# Patient Record
Sex: Female | Born: 1965 | Race: White | Hispanic: No | Marital: Married | Smoking: Never smoker
Health system: Southern US, Community
[De-identification: ages and names within clinical notes are randomized; demographics above are authoritative.]

## PROBLEM LIST (undated history)

## (undated) DIAGNOSIS — K259 Gastric ulcer, unspecified as acute or chronic, without hemorrhage or perforation: Secondary | ICD-10-CM

## (undated) DIAGNOSIS — E7800 Pure hypercholesterolemia, unspecified: Secondary | ICD-10-CM

## (undated) DIAGNOSIS — M810 Age-related osteoporosis without current pathological fracture: Secondary | ICD-10-CM

## (undated) DIAGNOSIS — N289 Disorder of kidney and ureter, unspecified: Secondary | ICD-10-CM

## (undated) DIAGNOSIS — K589 Irritable bowel syndrome without diarrhea: Secondary | ICD-10-CM

## (undated) HISTORY — DX: Gastric ulcer, unspecified as acute or chronic, without hemorrhage or perforation: K25.9

## (undated) HISTORY — DX: Age-related osteoporosis without current pathological fracture: M81.0

## (undated) HISTORY — DX: Pure hypercholesterolemia, unspecified: E78.00

## (undated) HISTORY — PX: LITHOTRIPSY: SUR834

## (undated) HISTORY — DX: Irritable bowel syndrome, unspecified: K58.9

## (undated) HISTORY — DX: Disorder of kidney and ureter, unspecified: N28.9

## (undated) HISTORY — PX: BACK SURGERY: SHX140

## (undated) HISTORY — PX: SINUS EXPLORATION: SHX5214

---

## 2003-01-08 ENCOUNTER — Encounter: Payer: Self-pay | Admitting: Neurosurgery

## 2003-01-08 ENCOUNTER — Ambulatory Visit (HOSPITAL_COMMUNITY): Admission: RE | Admit: 2003-01-08 | Discharge: 2003-01-08 | Payer: Self-pay | Admitting: Neurosurgery

## 2014-11-28 LAB — LAB REPORT - SCANNED

## 2017-03-01 DIAGNOSIS — J301 Allergic rhinitis due to pollen: Secondary | ICD-10-CM | POA: Insufficient documentation

## 2017-10-29 LAB — VITAMIN B12: Vitamin B-12: 437

## 2017-10-29 LAB — CBC AND DIFFERENTIAL
Neutrophils Absolute: 3.4
Platelets: 219 (ref 150–399)
WBC: 6.3

## 2017-10-29 LAB — BASIC METABOLIC PANEL
BUN: 14 (ref 4–21)
CO2: 29 — AB (ref 13–22)
Chloride: 103 (ref 99–108)
Creatinine: 0.8 (ref <=1.1)
Glucose: 77
Potassium: 4.8 (ref 3.4–5.3)
Sodium: 143 (ref 137–147)

## 2017-10-29 LAB — HEPATIC FUNCTION PANEL
ALT: 9 (ref 7–35)
AST: 14 (ref 13–35)
Alkaline Phosphatase: 51 (ref 25–125)
Bilirubin, Total: 0.1

## 2017-10-29 LAB — COMPREHENSIVE METABOLIC PANEL
Albumin: 4.4 (ref 3.5–5.0)
Calcium: 9.5 (ref 8.7–10.7)
Globulin: 2.4

## 2017-10-29 LAB — CBC: RBC: 4.2 (ref 3.87–5.11)

## 2017-10-29 LAB — VITAMIN D 25 HYDROXY (VIT D DEFICIENCY, FRACTURES): Vit D, 25-Hydroxy: 26.8

## 2017-11-05 LAB — HM COLONOSCOPY

## 2018-11-19 LAB — TSH: TSH: 1.66 (ref 0.41–5.90)

## 2020-01-05 ENCOUNTER — Other Ambulatory Visit: Payer: Self-pay | Admitting: Specialist

## 2020-01-05 DIAGNOSIS — M545 Low back pain, unspecified: Secondary | ICD-10-CM

## 2020-01-05 DIAGNOSIS — M25551 Pain in right hip: Secondary | ICD-10-CM

## 2020-01-05 DIAGNOSIS — G8929 Other chronic pain: Secondary | ICD-10-CM

## 2020-01-18 ENCOUNTER — Other Ambulatory Visit: Payer: Self-pay

## 2020-01-18 ENCOUNTER — Ambulatory Visit
Admission: RE | Admit: 2020-01-18 | Discharge: 2020-01-18 | Disposition: A | Discharge status: Home / Self Care | Payer: 59 | Source: Ambulatory Visit | Attending: Specialist | Admitting: Specialist

## 2020-01-18 DIAGNOSIS — M545 Low back pain, unspecified: Secondary | ICD-10-CM

## 2020-01-18 DIAGNOSIS — G8929 Other chronic pain: Secondary | ICD-10-CM

## 2020-01-18 MED ORDER — METHYLPREDNISOLONE ACETATE 40 MG/ML INJ SUSP (RADIOLOG
120.0000 mg | Freq: Once | INTRAMUSCULAR | Status: AC
  Administered 2020-01-18: 120 mg via EPIDURAL

## 2020-01-18 MED ORDER — IOPAMIDOL (ISOVUE-M 200) INJECTION 41%
1.0000 mL | Freq: Once | INTRAMUSCULAR | Status: AC
  Administered 2020-01-18: 1 mL via EPIDURAL

## 2020-01-18 NOTE — Discharge Instructions (Signed)

## 2020-01-22 LAB — BASIC METABOLIC PANEL
BUN: 13 (ref 4–21)
CO2: 27 — AB (ref 13–22)
Chloride: 102 (ref 99–108)
Creatinine: 0.8 (ref 0.5–1.1)
Glucose: 77
Potassium: 4.9 (ref 3.4–5.3)
Sodium: 141 (ref 137–147)

## 2020-01-22 LAB — TSH: TSH: 1.6 (ref 0.41–5.90)

## 2020-01-22 LAB — COMPREHENSIVE METABOLIC PANEL
Albumin: 4.3 (ref 3.5–5.0)
Calcium: 9.6 (ref 8.7–10.7)
Globulin: 2.3

## 2020-01-22 LAB — HEPATIC FUNCTION PANEL
ALT: 13 (ref 7–35)
AST: 16 (ref 13–35)
Alkaline Phosphatase: 50 (ref 25–125)
Bilirubin, Total: 0.5

## 2020-02-01 ENCOUNTER — Ambulatory Visit
Admission: RE | Admit: 2020-02-01 | Discharge: 2020-02-01 | Disposition: A | Discharge status: Home / Self Care | Payer: 59 | Source: Ambulatory Visit | Attending: Specialist | Admitting: Specialist

## 2020-02-01 DIAGNOSIS — M25551 Pain in right hip: Secondary | ICD-10-CM

## 2020-02-01 MED ORDER — IOPAMIDOL (ISOVUE-M 200) INJECTION 41%
1.0000 mL | Freq: Once | INTRAMUSCULAR | Status: AC
  Administered 2020-02-01: 1 mL via INTRA_ARTICULAR

## 2020-02-01 MED ORDER — METHYLPREDNISOLONE ACETATE 40 MG/ML INJ SUSP (RADIOLOG
120.0000 mg | Freq: Once | INTRAMUSCULAR | Status: AC
  Administered 2020-02-01: 120 mg via INTRA_ARTICULAR

## 2020-04-25 DIAGNOSIS — M51379 Other intervertebral disc degeneration, lumbosacral region without mention of lumbar back pain or lower extremity pain: Secondary | ICD-10-CM | POA: Insufficient documentation

## 2020-04-25 DIAGNOSIS — M5137 Other intervertebral disc degeneration, lumbosacral region: Secondary | ICD-10-CM | POA: Insufficient documentation

## 2020-08-15 LAB — COMPREHENSIVE METABOLIC PANEL
Albumin: 4.2 (ref 3.5–5.0)
Calcium: 9.8 (ref 8.7–10.7)
Globulin: 2.2

## 2020-08-15 LAB — BASIC METABOLIC PANEL
BUN: 18 (ref 4–21)
CO2: 28 — AB (ref 13–22)
Chloride: 107 (ref 99–108)
Creatinine: 0.8 (ref 0.5–1.1)
Glucose: 73
Potassium: 5.1 (ref 3.4–5.3)
Sodium: 149 — AB (ref 137–147)

## 2020-08-15 LAB — HEPATIC FUNCTION PANEL
ALT: 13 (ref 7–35)
AST: 15 (ref 13–35)
Alkaline Phosphatase: 66 (ref 25–125)
Bilirubin, Total: 0.4

## 2020-10-31 ENCOUNTER — Ambulatory Visit: Payer: 59 | Admitting: Medical-Surgical

## 2020-10-31 ENCOUNTER — Ambulatory Visit: Payer: 59 | Admitting: Family Medicine

## 2020-11-14 ENCOUNTER — Encounter: Payer: Self-pay | Admitting: Family Medicine

## 2020-11-14 ENCOUNTER — Other Ambulatory Visit: Payer: Self-pay

## 2020-11-14 ENCOUNTER — Ambulatory Visit (INDEPENDENT_AMBULATORY_CARE_PROVIDER_SITE_OTHER): Payer: PRIVATE HEALTH INSURANCE | Admitting: Family Medicine

## 2020-11-14 VITALS — BP 104/68 | HR 70 | Wt 112.0 lb

## 2020-11-14 DIAGNOSIS — R609 Edema, unspecified: Secondary | ICD-10-CM | POA: Diagnosis not present

## 2020-11-14 DIAGNOSIS — N200 Calculus of kidney: Secondary | ICD-10-CM

## 2020-11-14 DIAGNOSIS — E210 Primary hyperparathyroidism: Secondary | ICD-10-CM | POA: Diagnosis not present

## 2020-11-14 DIAGNOSIS — K219 Gastro-esophageal reflux disease without esophagitis: Secondary | ICD-10-CM

## 2020-11-14 DIAGNOSIS — K9049 Malabsorption due to intolerance, not elsewhere classified: Secondary | ICD-10-CM | POA: Diagnosis not present

## 2020-11-14 NOTE — Patient Instructions (Signed)
Have labs completed.  We'll be in touch with results.

## 2020-11-14 NOTE — Progress Notes (Signed)
Katherine Ramsey - 55 y.o. female MRN [id number]  Date of birth: October 09, 1965  Subjective Chief Complaint  Patient presents with  . Establish Care    HPI Katherine Ramsey is a 55 y.o. female here today for initial visit.  She has history of chronic abdominal pain, recurrent kidney stones, hyperparathyroidism, multiple food allergies.   Abdominal pain is throughout the abdomen but most often occurs in the epigastric area.  She does have some reflux and take omeprazole for this.  She is seeing [facility] and has been told that she has multiple food allergies.  She has had IGG levels checked which returned elevated against several foods, most notable gluten, dairy and eggs.  She also reports that this panel keeps changing at follow up visits so she has to continually modify her diet.    She does also have history of multiple renal stones.  History of hyperparathyroidism listed in some previous notes through care everywhere.  She has not had this followed up in quite sometime.  She has had pyelonephritis and was told at one point that she has some atrophy of her R kidney.    She has had some mild swelling in bilateral legs.   ROS:  A comprehensive ROS was completed and negative except as noted per HPI  Allergies  Allergen Reactions  . Dairycare [Lactase-Lactobacillus] Shortness Of Breath and Other (See Comments)    GI issues  . Eggs Or Egg-Derived Products Shortness Of Breath and Other (See Comments)    Abd Pain, GI issues  . Gluten Meal Shortness Of Breath and Other (See Comments)    GI issues  . Other     Past Medical History:  Diagnosis Date  . Hypercholesterolemia   . IBS (irritable bowel syndrome)   . Kidney disease     Past Surgical History:  Procedure Laterality Date  . BACK SURGERY    . CESAREAN SECTION    . LITHOTRIPSY Right   . SINUS EXPLORATION      Social History   Socioeconomic History  . Marital status: Married    Spouse name: Not on file  . Number of  children: Not on file  . Years of education: Not on file  . Highest education level: Not on file  Occupational History  . Not on file  Tobacco Use  . Smoking status: Not on file  . Smokeless tobacco: Not on file  Substance and Sexual Activity  . Alcohol use: Not on file  . Drug use: Not on file  . Sexual activity: Not on file  Other Topics Concern  . Not on file  Social History Narrative  . Not on file   Social Determinants of Health   Financial Resource Strain: Not on file  Food Insecurity: Not on file  Transportation Needs: Not on file  Physical Activity: Not on file  Stress: Not on file  Social Connections: Not on file    History reviewed. No pertinent family history.  Health Maintenance  Topic Date Due  . Hepatitis C Screening  Never done  . HIV Screening  Never done  . TETANUS/TDAP  Never done  . PAP SMEAR-Modifier  Never done  . COLONOSCOPY (Pts 45-69yrs Insurance coverage will need to be confirmed)  Never done  . MAMMOGRAM  Never done  . INFLUENZA VACCINE  Never done  . HPV VACCINES  Aged Out     ----------------------------------------------------------------------------------------------------------------------------------------------------------------------------------------------------------------- Physical Exam BP 104/68 (BP Location: Left Arm, Patient Position: Sitting, Cuff Size: Normal)  Pulse 70   Wt 112 lb (50.8 kg)   SpO2 100%   Physical Exam Constitutional:      Appearance: Normal appearance.  HENT:     Head: Normocephalic and atraumatic.  Eyes:     General: No scleral icterus. Cardiovascular:     Rate and Rhythm: Normal rate and regular rhythm.  Pulmonary:     Effort: Pulmonary effort is normal.     Breath sounds: Normal breath sounds.  Musculoskeletal:     Cervical back: Neck supple.     Right lower leg: No edema.     Left lower leg: No edema.  Neurological:     General: No focal deficit present.     Mental Status: She is alert.   Psychiatric:        Mood and Affect: Mood normal.        Behavior: Behavior normal.     ------------------------------------------------------------------------------------------------------------------------------------------------------------------------------------------------------------------- Assessment and Plan  Food intolerance History of multiple food intolerances. She is continually adjusting diet based on labs being drawn at [facility].  Discussed that antibodies are often formed against foods when consumed but there is not a strong correlation between IGG levels and intolerance to foods.  I think it is impossible for her to hit this moving target and likely worsening anxiety which may be worsening IBS which she likely has.      GERD (gastroesophageal reflux disease) She'll continue omeprazole as needed.   Primary hyperparathyroidism (HCC) History of hyperparathyroidism and recurrent kidney stones.  Updated labs including intact PTH, ionized calcium, vitamin d ordered.     No orders of the defined types were placed in this encounter.   No follow-ups on file.    This visit occurred during the SARS-CoV-2 public health emergency.  Safety protocols were in place, including screening questions prior to the visit, additional usage of staff PPE, and extensive cleaning of exam room while observing appropriate contact time as indicated for disinfecting solutions.

## 2020-11-14 NOTE — Assessment & Plan Note (Signed)
She'll continue omeprazole as needed.

## 2020-11-14 NOTE — Assessment & Plan Note (Signed)
History of hyperparathyroidism and recurrent kidney stones.  Updated labs including intact PTH, ionized calcium, vitamin d ordered.

## 2020-11-14 NOTE — Assessment & Plan Note (Signed)
History of multiple food intolerances. She is continually adjusting diet based on labs being drawn at [facility].  Discussed that antibodies are often formed against foods when consumed but there is not a strong correlation between IGG levels and intolerance to foods.  I think it is impossible for her to hit this moving target and likely worsening anxiety which may be worsening IBS which she likely has.

## 2020-11-15 ENCOUNTER — Encounter: Payer: Self-pay | Admitting: Family Medicine

## 2020-11-15 LAB — CBC
HCT: 41.8 % (ref 35.0–45.0)
Hemoglobin: 14 g/dL (ref 11.7–15.5)
MCH: 32 pg (ref 27.0–33.0)
MCHC: 33.5 g/dL (ref 32.0–36.0)
MCV: 95.4 fL (ref 80.0–100.0)
MPV: 13.3 fL — ABNORMAL HIGH (ref 7.5–12.5)
Platelets: 199 10*3/uL (ref 140–400)
RBC: 4.38 10*6/uL (ref 3.80–5.10)
RDW: 11.7 % (ref 11.0–15.0)
WBC: 7.1 10*3/uL (ref 3.8–10.8)

## 2020-11-15 LAB — VITAMIN D 25 HYDROXY (VIT D DEFICIENCY, FRACTURES): Vit D, 25-Hydroxy: 51 ng/mL (ref 30–100)

## 2020-11-15 LAB — COMPLETE METABOLIC PANEL WITH GFR
AG Ratio: 1.8 (calc) (ref 1.0–2.5)
ALT: 9 U/L (ref 6–29)
AST: 15 U/L (ref 10–35)
Albumin: 4.3 g/dL (ref 3.6–5.1)
Alkaline phosphatase (APISO): 63 U/L (ref 37–153)
BUN: 21 mg/dL (ref 7–25)
CO2: 31 mmol/L (ref 20–32)
Calcium: 9.5 mg/dL (ref 8.6–10.4)
Chloride: 105 mmol/L (ref 98–110)
Creat: 0.73 mg/dL (ref 0.50–1.05)
GFR, Est African American: 107 mL/min/{1.73_m2} (ref >=60)
GFR, Est Non African American: 92 mL/min/{1.73_m2} (ref >=60)
Globulin: 2.4 g/dL (calc) (ref 1.9–3.7)
Glucose, Bld: 94 mg/dL (ref 65–99)
Potassium: 4.8 mmol/L (ref 3.5–5.3)
Sodium: 142 mmol/L (ref 135–146)
Total Bilirubin: 0.3 mg/dL (ref 0.2–1.2)
Total Protein: 6.7 g/dL (ref 6.1–8.1)

## 2020-11-15 LAB — URINALYSIS
Bilirubin Urine: NEGATIVE
Glucose, UA: NEGATIVE
Hgb urine dipstick: NEGATIVE
Ketones, ur: NEGATIVE
Nitrite: NEGATIVE
Protein, ur: NEGATIVE
Specific Gravity, Urine: 1.009 (ref 1.001–1.03)
pH: 7 (ref 5.0–8.0)

## 2020-11-15 LAB — PARATHYROID HORMONE, INTACT (NO CA): PTH: 41 pg/mL (ref 14–64)

## 2020-11-15 LAB — CALCIUM, IONIZED: Calcium, Ion: 4.98 mg/dL (ref 4.8–5.6)

## 2020-11-15 LAB — TSH: TSH: 1.51 mIU/L (ref 0.40–4.50)

## 2020-11-29 ENCOUNTER — Encounter: Payer: Self-pay | Admitting: Family Medicine

## 2020-11-29 NOTE — Progress Notes (Signed)
Received outside labs from [facility].  Records reviewed and unremarkable other than low vitamin d level.

## 2020-12-06 ENCOUNTER — Encounter: Payer: Self-pay | Admitting: Family Medicine

## 2020-12-10 ENCOUNTER — Encounter: Payer: Self-pay | Admitting: Family Medicine

## 2021-05-01 ENCOUNTER — Ambulatory Visit (INDEPENDENT_AMBULATORY_CARE_PROVIDER_SITE_OTHER): Payer: PRIVATE HEALTH INSURANCE | Admitting: Family Medicine

## 2021-05-01 ENCOUNTER — Ambulatory Visit (HOSPITAL_BASED_OUTPATIENT_CLINIC_OR_DEPARTMENT_OTHER)
Admission: RE | Admit: 2021-05-01 | Discharge: 2021-05-01 | Disposition: A | Discharge status: Home / Self Care | Payer: PRIVATE HEALTH INSURANCE | Source: Ambulatory Visit | Attending: Family Medicine | Admitting: Family Medicine

## 2021-05-01 ENCOUNTER — Encounter: Payer: Self-pay | Admitting: Family Medicine

## 2021-05-01 ENCOUNTER — Ambulatory Visit: Payer: Self-pay

## 2021-05-01 ENCOUNTER — Other Ambulatory Visit: Payer: Self-pay

## 2021-05-01 VITALS — Ht 65.0 in | Wt 105.0 lb

## 2021-05-01 DIAGNOSIS — S73191A Other sprain of right hip, initial encounter: Secondary | ICD-10-CM | POA: Insufficient documentation

## 2021-05-01 DIAGNOSIS — M810 Age-related osteoporosis without current pathological fracture: Secondary | ICD-10-CM

## 2021-05-01 DIAGNOSIS — M169 Osteoarthritis of hip, unspecified: Secondary | ICD-10-CM | POA: Insufficient documentation

## 2021-05-01 MED ORDER — TRIAMCINOLONE ACETONIDE 40 MG/ML IJ SUSP
40.0000 mg | Freq: Once | INTRAMUSCULAR | Status: AC
  Administered 2021-05-01: 40 mg via INTRA_ARTICULAR

## 2021-05-01 NOTE — Progress Notes (Signed)
Katherine Ramsey - 55 y.o. female MRN [id number]  Date of birth: 21-Dec-1965  SUBJECTIVE:  Including CC & ROS.  No chief complaint on file.   Katherine Ramsey is a 55 y.o. female that is presenting with acute on chronic right hip pain.  The pain has been ongoing for over a year.  She has tried medications and over 6 weeks of physical therapy.  The pain is occurring at all times and has trouble with weightbearing.   Review of Systems See HPI   HISTORY: Past Medical, Surgical, Social, and Family History Reviewed & Updated per EMR.   Pertinent Historical Findings include:  Past Medical History:  Diagnosis Date   Hypercholesterolemia    IBS (irritable bowel syndrome)    Kidney disease     Past Surgical History:  Procedure Laterality Date   BACK SURGERY     CESAREAN SECTION     LITHOTRIPSY Right    SINUS EXPLORATION      No family history on file.  Social History   Socioeconomic History   Marital status: Married    Spouse name: Not on file   Number of children: Not on file   Years of education: Not on file   Highest education level: Not on file  Occupational History   Not on file  Tobacco Use   Smoking status: Not on file   Smokeless tobacco: Not on file  Substance and Sexual Activity   Alcohol use: Not on file   Drug use: Not on file   Sexual activity: Not on file  Other Topics Concern   Not on file  Social History Narrative   Not on file   Social Determinants of Health   Financial Resource Strain: Not on file  Food Insecurity: Not on file  Transportation Needs: Not on file  Physical Activity: Not on file  Stress: Not on file  Social Connections: Not on file  Intimate Partner Violence: Not on file     PHYSICAL EXAM:  VS: Ht 5\' 5"  (1.651 m)   Wt 105 lb (47.6 kg)   BMI 17.47 kg/m  Physical Exam Gen: NAD, alert, cooperative with exam, well-appearing    Aspiration/Injection Procedure Note Parisa Goeken 07/22/1966  Procedure: Injection Indications: Right  hip pain  Procedure Details Consent: Risks of procedure as well as the alternatives and risks of each were explained to the (patient/caregiver).  Consent for procedure obtained. Time Out: Verified patient identification, verified procedure, site/side was marked, verified correct patient position, special equipment/implants available, medications/allergies/relevent history reviewed, required imaging and test results available.  Performed.  The area was cleaned with iodine and alcohol swabs.    The right hip joint was injected using 3 cc 1% lidocaine on a 22-gauge 3-1/2 inch needle.  The syringe was switched and a mixture containing 1 cc's of 40 mg Kenalog and 4 cc's of 0.25% bupivacaine was injected.  Ultrasound was used. Images were obtained in long views showing the injection.     A sterile dressing was applied.  Patient did tolerate procedure well.      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  She has been dealing with this pain for over a year.  Seems more consistent with a labral tear as opposed to degenerative changes.  Has tried medications and over 6 weeks of physical therapy. -Counseled on home exercise therapy and supportive care. -Injection today. -X-ray. -MRI of the right hip to evaluate for labral tear  Age-related osteoporosis without current pathological fracture Last bone density was reported about 6 years ago.  Reports to having adverse effects of Forteo. -DEXA scan

## 2021-05-01 NOTE — Assessment & Plan Note (Signed)
Acute on chronic in nature.  She has been dealing with this pain for over a year.  Seems more consistent with a labral tear as opposed to degenerative changes.  Has tried medications and over 6 weeks of physical therapy. -Counseled on home exercise therapy and supportive care. -Injection today. -X-ray. -MRI of the right hip to evaluate for labral tear

## 2021-05-01 NOTE — Assessment & Plan Note (Signed)
Last bone density was reported about 6 years ago.  Reports to having adverse effects of Forteo. -DEXA scan

## 2021-05-01 NOTE — Patient Instructions (Signed)
Nice to meet you Please try ice  Please call [phone] to schedule the MRI  I will call with the xray results.  Please set up the bone density down stairs   Please send me a message in MyChart with any questions or updates.  We will setup a virtual visit once the MRI is resulted.   --Dr. Jordan Likes

## 2021-05-02 ENCOUNTER — Telehealth: Payer: Self-pay | Admitting: Family Medicine

## 2021-05-02 NOTE — Telephone Encounter (Signed)
Left VM for patient. If she calls back please have her speak with a nurse/CMA and inform that her bone density is showing significant osteoporosis.  We could consider Evenity or Prolia to help with this.  Her hip x-ray was showing degenerative changes of the joint.  We may need to consider physical therapy as well.   If any questions then please take the best time and phone number to call and I will try to call her back.   Myra Rude, MD [facility] 05/02/2021, 2:05 PM

## 2021-05-11 ENCOUNTER — Ambulatory Visit
Admission: RE | Admit: 2021-05-11 | Discharge: 2021-05-11 | Disposition: A | Discharge status: Home / Self Care | Payer: PRIVATE HEALTH INSURANCE | Source: Ambulatory Visit | Attending: Family Medicine | Admitting: Family Medicine

## 2021-05-11 DIAGNOSIS — S73191A Other sprain of right hip, initial encounter: Secondary | ICD-10-CM

## 2021-05-15 ENCOUNTER — Other Ambulatory Visit: Payer: Self-pay

## 2021-05-15 ENCOUNTER — Encounter: Payer: Self-pay | Admitting: Family Medicine

## 2021-05-15 ENCOUNTER — Telehealth (INDEPENDENT_AMBULATORY_CARE_PROVIDER_SITE_OTHER): Payer: PRIVATE HEALTH INSURANCE | Admitting: Family Medicine

## 2021-05-15 VITALS — Ht 65.0 in | Wt 105.0 lb

## 2021-05-15 DIAGNOSIS — M810 Age-related osteoporosis without current pathological fracture: Secondary | ICD-10-CM

## 2021-05-15 DIAGNOSIS — M1611 Unilateral primary osteoarthritis, right hip: Secondary | ICD-10-CM | POA: Diagnosis not present

## 2021-05-15 NOTE — Progress Notes (Signed)
Virtual Visit via Video Note  I connected with Katherine Ramsey on 05/15/21 at  8:00 AM EDT by a video enabled telemedicine application and verified that I am speaking with the correct person using two identifiers.  Location: Patient: home Provider: office   I discussed the limitations of evaluation and management by telemedicine and the availability of in person appointments. The patient expressed understanding and agreed to proceed.  History of Present Illness:  Katherine Ramsey is a 55 year old female that is following up after the MRI of her right hip.  This was showing significant degenerative changes of the hip joint.  Having chondral thinning and changes of the labrum.  She also was found to be osteoporotic.  No prior history of fractures and no family history significant for osteoporosis.  Observations/Objective:   Assessment and Plan:  Osteoporosis: Most recent bone density and was found to be -3.7.  No prior history of fracture.  No significant family history. -Pursue Evenity.  Osteoarthritis of right hip: MRI was revealing for degenerative changes of the hips that are leading to her acute on chronic pain.  She has tried physical therapy and repeat injections with limited improvement. -Counseled on home exercise therapy and supportive care. -Referral to orthopedic surgery.  Follow Up Instructions:    I discussed the assessment and treatment plan with the patient. The patient was provided an opportunity to ask questions and all were answered. The patient agreed with the plan and demonstrated an understanding of the instructions.   The patient was advised to call back or seek an in-person evaluation if the symptoms worsen or if the condition fails to improve as anticipated.    Clare Gandy, MD

## 2021-05-15 NOTE — Assessment & Plan Note (Signed)
Most recent bone density and was found to be -3.7.  No prior history of fracture.  No significant family history. -Pursue Evenity.

## 2021-05-15 NOTE — Assessment & Plan Note (Signed)
MRI was revealing for degenerative changes of the hips that are leading to her acute on chronic pain.  She has tried physical therapy and repeat injections with limited improvement. -Counseled on home exercise therapy and supportive care. -Referral to orthopedic surgery.

## 2021-08-14 ENCOUNTER — Other Ambulatory Visit: Payer: Self-pay

## 2021-08-14 ENCOUNTER — Encounter: Payer: Self-pay | Admitting: Family Medicine

## 2021-08-14 ENCOUNTER — Ambulatory Visit (INDEPENDENT_AMBULATORY_CARE_PROVIDER_SITE_OTHER): Payer: PRIVATE HEALTH INSURANCE | Admitting: Family Medicine

## 2021-08-14 VITALS — BP 107/68 | HR 55 | Temp 98.1°F | Ht 65.0 in | Wt 112.1 lb

## 2021-08-14 DIAGNOSIS — R5383 Other fatigue: Secondary | ICD-10-CM | POA: Diagnosis not present

## 2021-08-14 DIAGNOSIS — R42 Dizziness and giddiness: Secondary | ICD-10-CM | POA: Diagnosis not present

## 2021-08-14 DIAGNOSIS — N3000 Acute cystitis without hematuria: Secondary | ICD-10-CM

## 2021-08-14 DIAGNOSIS — R399 Unspecified symptoms and signs involving the genitourinary system: Secondary | ICD-10-CM

## 2021-08-14 LAB — POCT URINALYSIS DIP (CLINITEK)
Bilirubin, UA: NEGATIVE
Blood, UA: NEGATIVE
Glucose, UA: NEGATIVE mg/dL
Ketones, POC UA: NEGATIVE mg/dL
Nitrite, UA: NEGATIVE
POC PROTEIN,UA: NEGATIVE
Spec Grav, UA: 1.015 (ref 1.010–1.025)
Urobilinogen, UA: 0.2 E.U./dL
pH, UA: 7 (ref 5.0–8.0)

## 2021-08-14 NOTE — Patient Instructions (Signed)
There was some white blood cells in your urine. I am sending the urine for a culture to see if this is because of a urinary tract infection, or just superficial bacteria on your skin.  Blood work today to check for causes of your fatigue and lightheadedness.  We will let you know when the results are back and if we make any changes to your plan.  Please contact office for follow-up if symptoms do not improve or worsen. Seek emergency care if symptoms become severe.

## 2021-08-14 NOTE — Progress Notes (Signed)
Acute Office Visit  Subjective:    Patient ID: Katherine Ramsey, female    DOB: [birth year], 55 y.o.   MRN: [id number]  Chief Complaint  Patient presents with   Urinary Tract Infection   Dizziness   Fatigue    HPI Patient is in today for fatigue, dizziness, and urinary frequency.  Patient and husband report that she is chronically dizzy and has many "issues" going on, but wanted to have her urine and blood checked as she has some slight increase in urinary frequency but also remains fatigued with episodes of dizziness that she describes more like lightheadedness. 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 Reports that she follows up with them next week after 6 months of treatment and they will repeat blood work to see if she is better or needs to continue treatment. When asked about her energy level, stamina, and diet, she reports that she not able to eat a good variety because of her many food allergies - reports [facility] repeats serum food allergy panels every six months and lets her know what foods she should be avoiding. She reports chronic stomach ache/bloating no matter what she eats/drinks and states that some days are worse than others, weight has been relatively stable - she states that the doctor at [facility] told her it was related either to the Candida or the treatment.   She reports that the past 3 weeks she has felt more fatigued, weak, tired, lightheaded, occasional shortness of breath. She denies any spinning sensation, vision changes, chest pain, wheezing, nausea, vomiting, diarrhea, urinary urgency, dysuria, hematuria, diarrhea, constipation.        Past Medical History:  Diagnosis Date   Hypercholesterolemia    IBS (irritable bowel syndrome)    Kidney disease     Past Surgical  History:  Procedure Laterality Date   BACK SURGERY     CESAREAN SECTION     LITHOTRIPSY Right    SINUS EXPLORATION      No family history on file.  Social History   Socioeconomic History   Marital status: Married    Spouse name: Not on file   Number of children: Not on file   Years of education: Not on file   Highest education level: Not on file  Occupational History   Not on file  Tobacco Use   Smoking status: Never    Passive exposure: Never   Smokeless tobacco: Never  Substance and Sexual Activity   Alcohol use: Never   Drug use: Never   Sexual activity: Yes  Other Topics Concern   Not on file  Social History Narrative   Not on file   Social Determinants of Health   Financial Resource Strain: Not on file  Food Insecurity: Not on file  Transportation Needs: Not on file  Physical Activity: Not on file  Stress: Not on file  Social Connections: Not on file  Intimate Partner Violence: Not on file    No outpatient medications prior to visit.   No facility-administered medications prior to visit.    Allergies  Allergen Reactions   Dairycare [Lactase-Lactobacillus] Shortness Of Breath and Other (See Comments)    GI issues   Eggs Or Egg-Derived Products Shortness Of Breath and Other (See Comments)    Abd Pain, GI issues   Gluten Meal Shortness Of  Breath and Other (See Comments)    GI issues   Other     Review of Systems All review of systems negative except what is listed in the HPI     Objective:    Physical Exam Vitals reviewed.  Constitutional:      General: She is not in acute distress.    Appearance: Normal appearance. She is not ill-appearing.  HENT:     Head: Normocephalic and atraumatic.  Cardiovascular:     Rate and Rhythm: Normal rate and regular rhythm.     Heart sounds: Normal heart sounds.  Pulmonary:     Effort: Pulmonary effort is normal.     Breath sounds: Normal breath sounds.  Abdominal:     General: Abdomen is flat. Bowel  sounds are normal.     Palpations: Abdomen is soft.  Musculoskeletal:        General: Normal range of motion.     Cervical back: Normal range of motion and neck supple. No tenderness.  Lymphadenopathy:     Cervical: No cervical adenopathy.  Skin:    General: Skin is warm and dry.  Neurological:     General: No focal deficit present.     Mental Status: She is alert and oriented to person, place, and time. Mental status is at baseline.  Psychiatric:        Mood and Affect: Mood normal.        Behavior: Behavior normal.        Thought Content: Thought content normal.        Judgment: Judgment normal.      BP 107/68 (BP Location: Left Arm, Patient Position: Sitting, Cuff Size: Normal)   Pulse (!) 55   Temp 98.1 F (36.7 C) (Oral)   Ht 5\' 5"  (1.651 m)   Wt 112 lb 1.9 oz (50.9 kg)   SpO2 99%   BMI 18.66 kg/m  Wt Readings from Last 3 Encounters:  08/14/21 112 lb 1.9 oz (50.9 kg)  05/15/21 105 lb (47.6 kg)  05/01/21 105 lb (47.6 kg)    Health Maintenance Due  Topic Date Due   HIV Screening  Never done   Hepatitis C Screening  Never done   PAP SMEAR-Modifier  Never done   MAMMOGRAM  Never done    There are no preventive care reminders to display for this patient.   Lab Results  Component Value Date   TSH 1.51 11/14/2020   Lab Results  Component Value Date   WBC 7.1 11/14/2020   HGB 14.0 11/14/2020   HCT 41.8 11/14/2020   MCV 95.4 11/14/2020   PLT 199 11/14/2020   Lab Results  Component Value Date   NA 142 11/14/2020   K 4.8 11/14/2020   CO2 31 11/14/2020   GLUCOSE 94 11/14/2020   BUN 21 11/14/2020   CREATININE 0.73 11/14/2020   BILITOT 0.3 11/14/2020   ALKPHOS 66 08/15/2020   AST 15 11/14/2020   ALT 9 11/14/2020   PROT 6.7 11/14/2020   ALBUMIN 4.2 08/14/2020   CALCIUM 9.5 11/14/2020   No results found for: CHOL No results found for: HDL No results found for: LDLCALC No results found for: TRIG No results found for: CHOLHDL No results found for:  HGBA1C     Assessment & Plan:   1. UTI symptoms UA with small leuks. Sending for culture.  - POCT URINALYSIS DIP (CLINITEK) - Urinalysis, Routine w reflex microscopic - Urine Culture  2. Fatigue, unspecified type 3. Lightheadedness Patient  requesting all routine labs be updated today. We will let her know of any abnormalities and any changes to plan of care. Recommend she stay hydrated, rest, try to eat a balanced diet and remain active as able. Patient aware of signs/symptoms requiring further/urgent evaluation.  - CBC with Differential/Platelet - Comprehensive metabolic panel - Lipid panel - TSH - Vitamin D (25 hydroxy) - B12   Follow-up pending results or as needed.   Purcell Nails Olevia Bowens, DNP, FNP-C

## 2021-08-15 LAB — COMPREHENSIVE METABOLIC PANEL
AG Ratio: 1.8 (calc) (ref 1.0–2.5)
ALT: 14 U/L (ref 6–29)
AST: 15 U/L (ref 10–35)
Albumin: 4.3 g/dL (ref 3.6–5.1)
Alkaline phosphatase (APISO): 45 U/L (ref 37–153)
BUN: 16 mg/dL (ref 7–25)
CO2: 29 mmol/L (ref 20–32)
Calcium: 9.3 mg/dL (ref 8.6–10.4)
Chloride: 105 mmol/L (ref 98–110)
Creat: 0.59 mg/dL (ref 0.50–1.03)
Globulin: 2.4 g/dL (calc) (ref 1.9–3.7)
Glucose, Bld: 82 mg/dL (ref 65–139)
Potassium: 4.9 mmol/L (ref 3.5–5.3)
Sodium: 141 mmol/L (ref 135–146)
Total Bilirubin: 0.3 mg/dL (ref 0.2–1.2)
Total Protein: 6.7 g/dL (ref 6.1–8.1)

## 2021-08-15 LAB — VITAMIN B12: Vitamin B-12: 372 pg/mL (ref 200–1100)

## 2021-08-15 LAB — CBC WITH DIFFERENTIAL/PLATELET
Absolute Monocytes: 428 cells/uL (ref 200–950)
Basophils Absolute: 81 cells/uL (ref 0–200)
Basophils Relative: 1.3 %
Eosinophils Absolute: 260 cells/uL (ref 15–500)
Eosinophils Relative: 4.2 %
HCT: 40.5 % (ref 35.0–45.0)
Hemoglobin: 13.5 g/dL (ref 11.7–15.5)
Lymphs Abs: 1810 cells/uL (ref 850–3900)
MCH: 31.7 pg (ref 27.0–33.0)
MCHC: 33.3 g/dL (ref 32.0–36.0)
MCV: 95.1 fL (ref 80.0–100.0)
MPV: 12.5 fL (ref 7.5–12.5)
Monocytes Relative: 6.9 %
Neutro Abs: 3621 cells/uL (ref 1500–7800)
Neutrophils Relative %: 58.4 %
Platelets: 218 10*3/uL (ref 140–400)
RBC: 4.26 10*6/uL (ref 3.80–5.10)
RDW: 11.4 % (ref 11.0–15.0)
Total Lymphocyte: 29.2 %
WBC: 6.2 10*3/uL (ref 3.8–10.8)

## 2021-08-15 LAB — LIPID PANEL
Cholesterol: 214 mg/dL — ABNORMAL HIGH (ref <200)
HDL: 99 mg/dL (ref >=50)
LDL Cholesterol (Calc): 99 mg/dL (calc)
Non-HDL Cholesterol (Calc): 115 mg/dL (calc) (ref <130)
Total CHOL/HDL Ratio: 2.2 (calc) (ref <5.0)
Triglycerides: 70 mg/dL (ref <150)

## 2021-08-15 LAB — VITAMIN D 25 HYDROXY (VIT D DEFICIENCY, FRACTURES): Vit D, 25-Hydroxy: 31 ng/mL (ref 30–100)

## 2021-08-15 LAB — TSH: TSH: 2.24 mIU/L (ref 0.40–4.50)

## 2021-08-16 LAB — URINALYSIS, ROUTINE W REFLEX MICROSCOPIC
Bacteria, UA: NONE SEEN /HPF
Bilirubin Urine: NEGATIVE
Glucose, UA: NEGATIVE
Hgb urine dipstick: NEGATIVE
Hyaline Cast: NONE SEEN /LPF
Ketones, ur: NEGATIVE
Nitrite: NEGATIVE
Protein, ur: NEGATIVE
RBC / HPF: NONE SEEN /HPF (ref 0–2)
Specific Gravity, Urine: 1.006 (ref 1.001–1.035)
pH: 7.5 (ref 5.0–8.0)

## 2021-08-16 LAB — URINE CULTURE
MICRO NUMBER:: 12734722
SPECIMEN QUALITY:: ADEQUATE

## 2021-08-16 LAB — MICROSCOPIC MESSAGE

## 2021-08-18 MED ORDER — CEPHALEXIN 500 MG PO CAPS
500.0000 mg | ORAL_CAPSULE | Freq: Two times a day (BID) | ORAL | 0 refills | Status: DC
Start: 2021-08-18 — End: 2021-10-01

## 2021-08-18 NOTE — Addendum Note (Signed)
Addended by: Hyman Hopes B on: 08/18/2021 07:54 AM   Modules accepted: Orders

## 2021-08-25 ENCOUNTER — Ambulatory Visit: Payer: PRIVATE HEALTH INSURANCE | Admitting: Family Medicine

## 2021-08-28 ENCOUNTER — Encounter: Payer: Self-pay | Admitting: Family Medicine

## 2021-08-28 DIAGNOSIS — Z01419 Encounter for gynecological examination (general) (routine) without abnormal findings: Secondary | ICD-10-CM

## 2021-08-29 NOTE — Telephone Encounter (Signed)
Referral signed.

## 2021-10-01 NOTE — Progress Notes (Signed)
GYNECOLOGY ANNUAL PREVENTATIVE CARE ENCOUNTER NOTE  History:     Katherine Ramsey is a 56 y.o. G1P1 female here for a routine annual gynecologic exam.    Current complaints:  She has recurrent yeast infections.  She started having them 7-8 months ago.   She has no culture based result in our system that shows this. She has done monistat and diflucan in the past. She also notes recent UTIs for which she was given antibiotics for. She has generally felt worse after taking these medications. UA today was normal.  Chronic abdominal pain - She has had pain for the last 7 years. She has had a variety of work ups through her PCP but they have no been able to find a clear source. She does have varying and changing food allergies and she has to be very cautious with her diet. She is also very sensitive to topical medications. She reports an allergy that includes shell fish. She notes a prior doctor suggested possible role of laparoscopy to see if that could find the source of her pain. She has been seeing [facility] for this. She notes the pain is diffuse but has reported it as epigastric in the past on my review of those notes. She is taking omeprazole as needed for reflux based symptoms. PCP suspected some IBS. Additionally in 2016, she had colon biopsies showing colitis. I reviewed the Pcp notes from 11/2020 and 04/2021.  Vulvar lesion - it has been present for several years but the patient notes recent growth.  Vaginal dryness - She has had the dryness for a long time now and she has been unable to have intercourse. She tried OTC lubricants that were oil based but did not find those helpful.   Denies abnormal vaginal bleeding, discharge, pelvic pain, problems with intercourse or other gynecologic concerns.     Gynecologic History No LMP recorded. Last Pap: None on file in our system or in Care Everywhere.  Last Mammogram: 06/2021 per pt - did a mobile unit and was told normal mammogram.  Last  Colonoscopy: Per pt she goes regularly due to GI sensitivity.  Results have been normal pt pt.   Obstetric History OB History  Gravida Para Term Preterm AB Living  1 1          SAB IAB Ectopic Multiple Live Births          1    # Outcome Date GA Lbr Len/2nd Weight Sex Delivery Anes PTL Lv  1 Para      CS-Unspec       Past Medical History:  Diagnosis Date   Hypercholesterolemia    IBS (irritable bowel syndrome)    Kidney disease     Past Surgical History:  Procedure Laterality Date   BACK SURGERY     CESAREAN SECTION     LITHOTRIPSY Right    SINUS EXPLORATION      No current outpatient medications on file prior to visit.   No current facility-administered medications on file prior to visit.    Allergies  Allergen Reactions   Dairycare [Lactase-Lactobacillus] Shortness Of Breath and Other (See Comments)    GI issues   Eggs Or Egg-Derived Products Shortness Of Breath and Other (See Comments)    Abd Pain, GI issues   Gluten Meal Shortness Of Breath and Other (See Comments)    GI issues   Other    Shellfish Allergy Rash    Social History:  reports that she  has never smoked. She has never been exposed to tobacco smoke. She has never used smokeless tobacco. She reports that she does not drink alcohol and does not use drugs.  Family History  Problem Relation Age of Onset   Colon cancer Father     The following portions of the patient's history were reviewed and updated as appropriate: allergies, current medications, past family history, past medical history, past social history, past surgical history and problem list.  Review of Systems Pertinent items noted in HPI and remainder of comprehensive ROS otherwise negative.  Physical Exam:  BP (!) 91/58    Pulse 66    Ht 5\' 5"  (1.651 m)    Wt 108 lb (49 kg)    BMI 17.97 kg/m  CONSTITUTIONAL: Well-developed, well-nourished female in no acute distress.  HENT:  Normocephalic, atraumatic, External right and left ear  normal.  EYES: Conjunctivae and EOM are normal. Pupils are equal, round, and reactive to light. No scleral icterus.  NECK: Normal range of motion, supple, no masses.  Normal thyroid.  SKIN: Skin is warm and dry. No rash noted. Not diaphoretic. No erythema. No pallor. MUSCULOSKELETAL: Normal range of motion. No tenderness.  No cyanosis, clubbing, or edema. NEUROLOGIC: Alert and oriented to person, place, and time. Normal reflexes, muscle tone coordination.  PSYCHIATRIC: Normal mood and affect. Normal behavior. Normal judgment and thought content.  CARDIOVASCULAR: Normal heart rate noted, regular rhythm RESPIRATORY: Clear to auscultation bilaterally. Effort and breath sounds normal, no problems with respiration noted.  BREASTS: Symmetric in size. No masses, tenderness, skin changes, nipple drainage, or lymphadenopathy bilaterally. Performed in the presence of a chaperone. ABDOMEN: Soft, no distention noted.  No tenderness, rebound or guarding.  PELVIC: External genitalia normal except just below the clitoris was a 7 mm condylomatous appearing growth that has a narrow base, Vagina normal without discharge but atrophy noted, Urethra without abnormality or discharge, no bladder tenderness, cervix normal in appearance, no CMT, uterus normal size, shape, and consistency, no adnexal masses or tenderness. Bilateral levator tenderness Performed in the presence of a chaperone.  VULVAR BIOPSY NOTE The indications for vulvar biopsy  of growing vulvar lesion  was reviewed.   Risks of the biopsy including pain, bleeding, infection, inadequate specimen, scarring and need for additional procedures  were discussed. The patient stated understanding and agreed to undergo procedure today. Consent was signed, time out performed.   The patient's vulva was prepped with Hibiclens. 1% lidocaine was injected into the base of the lesion. Tissue forceps used to grasp the lesion and it was cut across the base of the lesion.   Small bleeding was noted and hemostasis was achieved using silver nitrate sticks.  The patient tolerated the procedure well.   Post-procedure instructions (pelvic rest for one week) were given to the patient. The patient is to call with heavy bleeding, fever greater than 100.4, foul smelling vaginal discharge or other concerns.    Assessment and Plan:  Ketty was seen today for discuss recurrent yeast infect and menopause.  Diagnoses and all orders for this visit:  Encounter for annual routine gynecological examination - Cervical cancer screening: Discussed guidelines. Pap with HPV done  - Breast Health: Encouraged self breast awareness/SBE. Discussed limits of clinical breast exam for detecting breast cancer. Discussed importance of annual MXR.  Rx given - Climacteric/Sexual health: Reviewed typical and atypical symptoms of menopause/peri-menopause. Discussed PMB and to call if any amount of spotting.  - Bone Health: Calcium via diet and supplementation. Discussed weight  bearing exercise. DEXA per PCP as pt report osteoporosis being managed by them - Colonoscopy: up to date (2019) - F/U 12 months and prn  -     Cytology - PAP -     MM 3D SCREEN BREAST BILATERAL; Future  Recurrent candidiasis of vagina - Unclear if patient actually has vaginal candidiasis vs this being long standing vaginal atrophy - Cultures checked today - UA negative for UTI as well -     Cervicovaginal ancillary only( [place])  Vulvar lesion - Given recent growth, I recommended removal which could be done in the office today. I could have rescheduled but given the patient last seeing a gynecologist about 3-5 years ago, I was concerned about follow up.  - Consent reviewed and signed.  - Procedure performed without an issue. Will await pathology - suspect will be benign based on appearance despite growth.  -     Surgical pathology( [place]/ [facility])  Vaginal dryness - Atrophy could be etiology behind her  urinary and vaginal symptoms - recommended nightly for 2 weeks and then 2-3 times a week thereafter -     estradiol (ESTRACE) 0.1 MG/GM vaginal cream; Apply 1 gram per vagina every night for 2 weeks, then apply three times a week  Levator spasm - Levator muscles extremely tender on exam. This could be contributing to her abdominal pain - We discussed that based on exam today and her being postmenopausal, there would be little utility for diagnostic laparoscopy from my perspective. If pain persists, she could have a pelvic US although she has had abdominal imaging in the past that has been normal over the course of the 7 years of abdominal pain (CT done 11/2017 was normal - I reviewed report in Care Everywhere).  - Recommended 2 weeks of the vaginal therapy before initiating PFPT -     Ambulatory referral to Physical Therapy    Routine preventative health maintenance measures emphasized. Please refer to After Visit Summary for other counseling recommendations.   Radene Gunning, MD, [place] for [facility], [place]

## 2021-10-02 ENCOUNTER — Ambulatory Visit: Payer: PRIVATE HEALTH INSURANCE | Admitting: Obstetrics and Gynecology

## 2021-10-02 ENCOUNTER — Other Ambulatory Visit: Payer: Self-pay

## 2021-10-02 ENCOUNTER — Encounter: Payer: Self-pay | Admitting: Obstetrics and Gynecology

## 2021-10-02 ENCOUNTER — Other Ambulatory Visit (HOSPITAL_COMMUNITY)
Admission: RE | Admit: 2021-10-02 | Discharge: 2021-10-02 | Disposition: A | Discharge status: Home / Self Care | Payer: PRIVATE HEALTH INSURANCE | Source: Ambulatory Visit | Attending: Obstetrics and Gynecology | Admitting: Obstetrics and Gynecology

## 2021-10-02 VITALS — BP 91/58 | HR 66 | Ht 65.0 in | Wt 108.0 lb

## 2021-10-02 DIAGNOSIS — A630 Anogenital (venereal) warts: Secondary | ICD-10-CM

## 2021-10-02 DIAGNOSIS — N9089 Other specified noninflammatory disorders of vulva and perineum: Secondary | ICD-10-CM | POA: Insufficient documentation

## 2021-10-02 DIAGNOSIS — B3731 Acute candidiasis of vulva and vagina: Secondary | ICD-10-CM | POA: Diagnosis not present

## 2021-10-02 DIAGNOSIS — Z01419 Encounter for gynecological examination (general) (routine) without abnormal findings: Secondary | ICD-10-CM | POA: Insufficient documentation

## 2021-10-02 DIAGNOSIS — M62838 Other muscle spasm: Secondary | ICD-10-CM

## 2021-10-02 DIAGNOSIS — N898 Other specified noninflammatory disorders of vagina: Secondary | ICD-10-CM | POA: Diagnosis not present

## 2021-10-02 MED ORDER — ESTRADIOL 0.1 MG/GM VA CREA: TOPICAL_CREAM | VAGINAL | 3 refills | Status: DC

## 2021-10-03 LAB — CERVICOVAGINAL ANCILLARY ONLY
Bacterial Vaginitis (gardnerella): NEGATIVE
Candida Glabrata: NEGATIVE
Candida Vaginitis: NEGATIVE
Comment: NEGATIVE
Comment: NEGATIVE
Comment: NEGATIVE
Comment: NEGATIVE
Trichomonas: NEGATIVE

## 2021-10-06 LAB — CYTOLOGY - PAP
Comment: NEGATIVE
Diagnosis: NEGATIVE
High risk HPV: NEGATIVE

## 2021-10-06 LAB — SURGICAL PATHOLOGY

## 2021-10-09 ENCOUNTER — Encounter: Payer: Self-pay | Admitting: Physical Therapy

## 2021-10-09 ENCOUNTER — Ambulatory Visit: Payer: PRIVATE HEALTH INSURANCE | Attending: Obstetrics and Gynecology | Admitting: Physical Therapy

## 2021-10-09 ENCOUNTER — Other Ambulatory Visit: Payer: Self-pay

## 2021-10-09 DIAGNOSIS — M6281 Muscle weakness (generalized): Secondary | ICD-10-CM | POA: Diagnosis not present

## 2021-10-09 DIAGNOSIS — M62838 Other muscle spasm: Secondary | ICD-10-CM | POA: Diagnosis not present

## 2021-10-10 NOTE — Therapy (Signed)
[facility] @ [facility] [address] [place], [place], [address] Phone: [phone]   Fax:  [phone]  Physical Therapy Evaluation  Patient Details  Name: Katherine Ramsey MRN: [id number] Date of Birth: 06/24/1966 Referring Provider (Katherine Ramsey): Milas Hock, MD   Encounter Date: 10/09/2021   Katherine Ramsey End of Session - 10/10/21 1957     Visit Number 1    Date for Katherine Ramsey Re-Evaluation 01/01/22    Authorization Type [employer]    Katherine Ramsey Start Time 1620    Katherine Ramsey Stop Time 1658    Katherine Ramsey Time Calculation (min) 38 min    Behavior During Therapy [facility] for tasks assessed/performed             Past Medical History:  Diagnosis Date   Hypercholesterolemia    IBS (irritable bowel syndrome)    Kidney disease     Past Surgical History:  Procedure Laterality Date   BACK SURGERY     CESAREAN SECTION     LITHOTRIPSY Right    SINUS EXPLORATION      There were no vitals filed for this visit.    Subjective Assessment - 10/09/21 1625     Subjective Katherine Ramsey states this has been ongoing since last year.  I have pain that is up and down, maybe worse when cold.  I also have neck pain.    Pertinent History kidney issue, low back and hip OA, osteoporosis, a lot of food allergies, IBS    How long can you sit comfortably? 30 minutes    Patient Stated Goals reduce    Currently in Pain? Yes    Pain Score 8     Pain Location Back    Pain Orientation Right;Left   more on the right   Pain Descriptors / Indicators Sore;Dull;Sharp    Pain Type Chronic pain    Pain Radiating Towards wrapping around Rt side and mid back    Pain Onset More than a month ago    Pain Frequency Constant    Aggravating Factors  standing and sitting long time    Pain Relieving Factors change positions, distracted    Multiple Pain Sites No                [facility] Katherine Ramsey Assessment - 10/10/21 0001       Assessment   Medical Diagnosis M62.838 (ICD-10-CM) - Levator spasm    Referring Provider (Katherine Ramsey) Milas Hock, MD    Prior Therapy No      Precautions   Precautions Other (comment)    Precaution Comments osteoporosis      Balance Screen   Has the patient fallen in the past 6 months No      Home Environment   Living Environment Private residence    Living Arrangements Spouse/significant other      Prior Function   Level of Independence Independent      Cognition   Overall Cognitive Status Within Functional Limits for tasks assessed      Posture/Postural Control   Posture Comments trunk sidebend Rt      PROM   Overall PROM Comments hip PROM Rt50% +pain; Lt 75%      Strength   Overall Strength Comments hip 4+/5 bil      Flexibility   Soft Tissue Assessment /Muscle Length yes    Hamstrings 60% +pain      Palpation   SI assessment  locks more on Rt side with fwd flex  Special Tests   Other special tests ASLR compression not helpful                        Objective measurements completed on examination: See above findings.     Pelvic Floor Special Questions - 10/10/21 0001     Prior Pregnancies Yes    Number of Pregnancies 1    Number of C-Sections 1    Currently Sexually Active No    Urinary Leakage No    Urinary urgency Yes   due to kidney   Urinary frequency nocturia 1-2; every hour daytime    Fluid intake 4 bottles at least    Perineal Body/Introitus  Elevated    Prolapse None    Pelvic Floor Internal Exam Katherine Ramsey identity confirmed and informed consent given    Exam Type Vaginal    Palpation Tight and tender bil levators Rt>Lt and transverse peroneus    Strength fair squeeze, definite lift    Strength # of seconds 8    Tone high                    No emotional/communication barriers or cognitive limitation. Patient is motivated to learn. Patient understands and agrees with treatment goals and plan. Katherine Ramsey explains patient will be examined in standing, sitting, and lying down to see how their muscles and joints work. When they are  ready, they will be asked to remove their underwear so Katherine Ramsey can examine their perineum. The patient is also given the option of providing their own chaperone as one is not provided in our facility. The patient also has the right and is explained the right to defer or refuse any part of the evaluation or treatment including the internal exam. With the patient's consent, Katherine Ramsey will use one gloved finger to gently assess the muscles of the pelvic floor, seeing how well it contracts and relaxes and if there is muscle symmetry. After, the patient will get dressed and Katherine Ramsey and patient will discuss exam findings and plan of care. Katherine Ramsey and patient discuss plan of care, schedule, attendance policy and HEP activities.         Katherine Ramsey Long Term Goals - 10/10/21 1953       Katherine Ramsey LONG TERM GOAL #1   Title Katherine Ramsey will be ind with advanced HEP to maintain improvements made withPT    Time 12    Period Weeks    Status New    Target Date 01/01/22      Katherine Ramsey LONG TERM GOAL #2   Title Katherine Ramsey will be ind with using vaginal trainer at home for maintaining healthy pelvic floor muscle    Time 12    Period Weeks    Status New    Target Date 01/01/22      Katherine Ramsey LONG TERM GOAL #3   Title Katherine Ramsey will report 50% less back and hip pain    Time 12    Period Weeks    Status New    Target Date 01/01/22                    Plan - 10/09/21 1708     Clinical Impression Statement Katherine Ramsey presents to clinic due to chronic bil back and hip pain.  She is not sexually active due to her condition.  Katherine Ramsey has had pain for years and recently at GYN exam was having difficulty tolerating exam and found to have very tight pelvic  floor.  Today's assessment confirms pelvic floor tension. Katherine Ramsey has hypomobility bil hip Rt>Lt.  Katherine Ramsey has tight muscle and spasms throughout spine, gluteals and posterior LEs.  Katherine Ramsey will benefit from skilled Katherine Ramsey to address soft tissue impairments so that she can function more without as much pain.    Personal Factors and Comorbidities  Comorbidity 3+    Comorbidities kidney issue, low back and hip OA, osteoporosis, a lot of food allergies, IBS    Examination-Activity Limitations Stand;Sit;Other   effects her entire day which is to manage pain   Examination-Participation Restrictions Community Activity;Driving;Cleaning    Stability/Clinical Decision Making Evolving/Moderate complexity    Clinical Decision Making Moderate    Katherine Ramsey Frequency 1x / week    Katherine Ramsey Duration 12 weeks    Katherine Ramsey Treatment/Interventions ADLs/Self Care Home Management;Biofeedback;Cryotherapy;Electrical Stimulation;Moist Heat;Therapeutic activities;Therapeutic exercise;Neuromuscular re-education;Patient/family education;Manual techniques;Taping;Passive range of motion;Dry needling    Katherine Ramsey Next Visit Plan STM gluteal, lumbar, abdominal, kidney mobs; information on dilators and where to order    Consulted and Agree with Plan of Care Patient             Patient will benefit from skilled therapeutic intervention in order to improve the following deficits and impairments:  Pain, Postural dysfunction, Increased fascial restricitons, Decreased strength, Decreased coordination, Decreased range of motion, Increased muscle spasms, Difficulty walking, Impaired tone  Visit Diagnosis: Levator spasm     Problem List Patient Active Problem List   Diagnosis Date Noted   OA (osteoarthritis) of hip 05/01/2021   Age-related osteoporosis without current pathological fracture 05/01/2021   Food intolerance 11/14/2020   GERD (gastroesophageal reflux disease) 11/14/2020   Primary hyperparathyroidism (HCC) 11/14/2020   Degenerative disc disease at L5-S1 level 04/25/2020   Non-seasonal allergic rhinitis due to pollen 03/01/2017    Katherine Ramsey, Katherine Ramsey 10/10/2021, 8:21 PM  [facility] Out[facility] & Specialty Rehab @ [facility] [address] [place], [place], [address] Phone: [phone]   Fax:  [phone]  Name: Katherine Ramsey MRN: [id number] Date of Birth:  [birth year]

## 2021-10-24 ENCOUNTER — Ambulatory Visit: Payer: PRIVATE HEALTH INSURANCE | Admitting: Physical Therapy

## 2021-10-24 ENCOUNTER — Other Ambulatory Visit: Payer: Self-pay

## 2021-10-24 ENCOUNTER — Encounter: Payer: Self-pay | Admitting: Physical Therapy

## 2021-10-24 DIAGNOSIS — M6281 Muscle weakness (generalized): Secondary | ICD-10-CM

## 2021-10-24 DIAGNOSIS — M62838 Other muscle spasm: Secondary | ICD-10-CM | POA: Diagnosis not present

## 2021-10-24 NOTE — Therapy (Signed)
[facility] Outpatient & Specialty Rehab @ [facility] [address] [place], [place], [address] Phone: [phone]   Fax:  [phone]  Physical Therapy Treatment  Patient Details  Name: Katherine Ramsey MRN: [id number] Date of Birth: 11-30-1965 Referring Provider (PT): Milas Hock, MD   Encounter Date: 10/24/2021   PT End of Session - 10/24/21 0918     Visit Number 2    Date for PT Re-Evaluation 01/01/22    Authorization Type [employer]    PT Start Time 0846    PT Stop Time 0927    PT Time Calculation (min) 41 min    Activity Tolerance Patient tolerated treatment well    Behavior During Therapy [facility] for tasks assessed/performed             Past Medical History:  Diagnosis Date   Hypercholesterolemia    IBS (irritable bowel syndrome)    Kidney disease     Past Surgical History:  Procedure Laterality Date   BACK SURGERY     CESAREAN SECTION     LITHOTRIPSY Right    SINUS EXPLORATION      There were no vitals filed for this visit.   Subjective Assessment - 10/24/21 1001     Subjective Nothing new to report today.  Pt asking about estrodiol med if it can cause discharge.    Currently in Pain? Yes    Pain Score 7     Pain Location Hip    Pain Orientation Right    Pain Descriptors / Indicators Sore;Dull    Pain Type Chronic pain    Pain Onset More than a month ago    Pain Frequency Intermittent    Aggravating Factors  lifting leg, standing    Multiple Pain Sites No                               [facility] Adult PT Treatment/Exercise - 10/24/21 0001       Self-Care   Self-Care Other Self-Care Comments    Other Self-Care Comments  vaginal trainer use and where to order      Lumbar Exercises: Supine   Other Supine Lumbar Exercises thoracic bow and arrows and rotation in supine - 10x each with holding for 2 breaths      Manual Therapy   Manual Therapy Soft tissue mobilization;Myofascial release    Soft tissue mobilization lumbar  and thoracic paraspinals    Myofascial Release kidney and ureter releases; stomach and liver                     PT Education - 10/24/21 0944     Education Details educated on vaginal trainers and demo shown - gave qtip and uberlub    Person(s) Educated Patient    Methods Explanation;Demonstration;Verbal cues;Handout    Comprehension Verbalized understanding                 PT Long Term Goals - 10/24/21 1001       PT LONG TERM GOAL #1   Title Pt will be ind with advanced HEP to maintain improvements made withPT    Status On-going      PT LONG TERM GOAL #2   Title Pt will be ind with using vaginal trainer at home for maintaining healthy pelvic floor muscle    Status On-going      PT LONG TERM GOAL #3   Title Pt will report 50%  less back and hip pain    Status On-going                   Plan - 10/24/21 0945     Clinical Impression Statement Pt presents to clinic today Rt hip is painful with lifting leg into the bed.  Pt  was leaning way over to the Rt side even in supine and feels like she is straight.  At the end of the session there was much less side bend.  Pt responded well to kidney mobs and fascial release on the left side.  Thoracic mobilty and education on stretches to maintain improved flexibility were done after STM.  Pt was also educated in dilators to begin working on pelvic tension.    Comorbidities kidney issue, low back and hip OA, osteoporosis, a lot of food allergies, IBS    PT Treatment/Interventions ADLs/Self Care Home Management;Biofeedback;Cryotherapy;Electrical Stimulation;Moist Heat;Therapeutic activities;Therapeutic exercise;Neuromuscular re-education;Patient/family education;Manual techniques;Taping;Passive range of motion;Dry needling    PT Next Visit Plan f/u on kidney fascial release; thoracic rotation in supine add to HEP; review using and internal fascial release    PT Home Exercise Plan vag trainer handout    Consulted and  Agree with Plan of Care Patient             Patient will benefit from skilled therapeutic intervention in order to improve the following deficits and impairments:  Pain, Postural dysfunction, Increased fascial restricitons, Decreased strength, Decreased coordination, Decreased range of motion, Increased muscle spasms, Difficulty walking, Impaired tone  Visit Diagnosis: Levator spasm  Muscle weakness (generalized)     Problem List Patient Active Problem List   Diagnosis Date Noted   OA (osteoarthritis) of hip 05/01/2021   Age-related osteoporosis without current pathological fracture 05/01/2021   Food intolerance 11/14/2020   GERD (gastroesophageal reflux disease) 11/14/2020   Primary hyperparathyroidism (HCC) 11/14/2020   Degenerative disc disease at L5-S1 level 04/25/2020   Non-seasonal allergic rhinitis due to pollen 03/01/2017    [name], PT 10/24/2021, 10:15 AM  [facility] Outpatient & Specialty Rehab @ [facility] [address] [place], [place], [address] Phone: [phone]   Fax:  [phone]  Name: Katherine Ramsey MRN: [id number] Date of Birth: Sep 29, 1965

## 2021-10-24 NOTE — Patient Instructions (Signed)
Vaginal trainers  Prior to Use:   Wash the vaginal trainer with soap and water before and after each use.   Use a water-soluble lubricant like Slippery Stuff or Surgulibe.   Avoid using Vaseline, coconut oil, or other oil-based lubricants. They are not water-soluble and can be irritating to the tissues in the vagina.   Do not use silicon-based lubricants with a silicon vaginal trainer. Using a siliconbased lubricant with a silicon device can contribute to break down of the material.  Setting Up Your Space   Work in a comfortable room lying on your back on a bed or couch with your knees bent and knees relaxed open. Use pillows underneath your knees as they are relaxed open and to support your upper back and head. Place a towel underneath your bottom to collect any lubricant.   Place your vaginal trainers and lubricant on a towel next to you within arm's reach for easy access.   Starting to use your trainer:  o Take 10-20 deep breaths to quiet your nervous system  o Perform stretches to help relax your hips and pelvic floor such as child's pose, cat/cow, or happy baby pose  Using Your Trainers   Coat the smallest vaginal trainer, or the size you are most comfortable using, with lubricant   Place the tip of the trainer at the opening to the vagina.   Take a few deep breaths to adjust to the sensation of the lubricant and trainer.   Slowly insert the rounded end of the trainer into your vaginal opening as far as you are comfortable. Pause and breathe if you experience pain, tension, or muscle guarding at any time. Once you feel comfortable gently slide trainer deeper into the vaginal canal as far as it will go without causing pain or discomfort.  Progressing with your Trainers   Gently press the trainer toward the bottom and sides of the vaginal opening to give it a gentle stretch. Pause and breathe at each spot and tension melts away.   Once fully inserted, turn the trainer clockwise and  counterclockwise to produce different sensations   Slowly move the trainer in and out as you breathe and focus on staying as relaxed as possible  To progress to the next size gradually, once one trainer is completely pain-free and comfortable to use, insert that smaller trainer first for 5-10 minutes and then follow with the next largest size trainer for 5-10 minutes. Gradually decrease the length of time using the smaller trainer as you increase the length of time using the larger trainer.   Move at your pace ad what's comfortable for you.  Wrapping up your session   Use trainers for 5-10 minutes every other day or 3 times a week   Wash and dry your vaginal trainers after use  Other considerations: Try to approach using vaginal trainers from a place of curiosity instead of judgment - what can my body do today, vs. why can't it do this, or I should be able to do this. Try letting go of that idea that it should be different, and try to meet yourself where you are at, without the pressure to change anything Do you bring your vaginal trainers into PT? Sometimes, bringing your trainers into sessions with your PT and having them talk you through the process while you are in control of the trainer can be helpful. Maybe they can help you find ways to make insertion a bit easier for you, or they can   help remind you to breathe. If you aren't doing this, I definitely recommend talking to a PT about it. Sometimes knowing the physical tools you have can help with the mental game. One thing that can be helpful to do before jumping to dilating is called "cupping". It is just taking your hand and holding your palm to your vulva and breathing. Doing this before doing any type of trainer training can be helpful as it lets you take a second and check in, vs jumping right in. Kind of like a warm up to your workout! Try different tools and see if you like another one more. Some of our patients prefer crystal wands or  plastic trainers to silicone, some prefer starting with a vibrating pelvic wand instead of a trainer, look at different options and see what interests you. You can also try different lubricants. And don't feel as though you need to jump right into inserting anything. The first few times (or minutes of the session) may just be about putting it at the entrance without inserting, and that's ok! We have also had patients find success with an external vibrator on their pubic bone while they use trainers as this can help distract nerves and increase muscle relaxation. This can be helpful to normalize the trainers. Leave the one you are currently using and the one you want to progress to somewhere you see it every day, like the bathroom counter or the bedside table. Seeing them every day can make them less intimidating. The more you do something, the more routine it becomes, so setting a vaginal trainers schedule and sticking to it can help make it less intimidating. Last thing that could be helpful to you is to set yourself up a relaxing environment when you use your vaginal trainers. Play your favorite calming music, light your favorite candle, incense, or turn on your diffuser, wear your coziest t-shirt and socks, prop your legs on pillows, anything that feels like a big exhale. Don't distract yourself with tv or a movie. Stay tuned into your body to help maintain relaxation Listening to relaxing music or meditations can also be helpful. Preferences for guided meditations can be so different from person to person so find one you feel relaxed/safe with!      Copyright 2023 [employer]   

## 2021-10-30 ENCOUNTER — Encounter: Payer: PRIVATE HEALTH INSURANCE | Admitting: Physical Therapy

## 2021-11-06 ENCOUNTER — Ambulatory Visit: Payer: PRIVATE HEALTH INSURANCE | Attending: Obstetrics and Gynecology | Admitting: Physical Therapy

## 2021-11-06 ENCOUNTER — Other Ambulatory Visit: Payer: Self-pay

## 2021-11-06 DIAGNOSIS — M62838 Other muscle spasm: Secondary | ICD-10-CM | POA: Diagnosis not present

## 2021-11-06 DIAGNOSIS — M6281 Muscle weakness (generalized): Secondary | ICD-10-CM | POA: Insufficient documentation

## 2021-11-06 NOTE — Therapy (Addendum)
[facility] @ [address] [place], [place], [address] Phone: [phone]   Fax:  [phone]  Physical Therapy Treatment  Patient Details  Name: Katherine Ramsey MRN: [id number] Date of Birth: [birth year] Referring Provider (PT): Radene Gunning, MD   Encounter Date: 11/06/2021   PT End of Session - 11/06/21 1536     Visit Number 3    Date for PT Re-Evaluation 01/01/22    Authorization Type [employer]    PT Start Time 1533    PT Stop Time 1614    PT Time Calculation (min) 41 min    Activity Tolerance Patient tolerated treatment well    Behavior During Therapy [facility] for tasks assessed/performed             Past Medical History:  Diagnosis Date   Hypercholesterolemia    IBS (irritable bowel syndrome)    Kidney disease     Past Surgical History:  Procedure Laterality Date   BACK SURGERY     CESAREAN SECTION     LITHOTRIPSY Right    SINUS EXPLORATION      There were no vitals filed for this visit.   Subjective Assessment - 11/06/21 1537     Subjective I used the estrogen cream and it feels like things are burning like  UTI.    Pertinent History kidney issue, low back and hip OA, osteoporosis, a lot of food allergies, IBS    Patient Stated Goals reduce    Currently in Pain? Yes    Pain Score 7     Pain Location Hip    Pain Orientation Right    Pain Descriptors / Indicators Dull    Pain Type Chronic pain    Pain Onset More than a month ago    Pain Frequency Intermittent    Multiple Pain Sites No                               [facility] Adult PT Treatment/Exercise - 11/06/21 0001       Manual Therapy   Manual Therapy Internal Pelvic Floor    Manual therapy comments pt idientity confirmed and internal Soft tissue treat with consent    Internal Pelvic Floor transversus peroneus, levators, vaginal canal with bladder mobs simultaneously; working on umbilicus causes more pain/tenderness so only did  this briefly                          PT Long Term Goals - 10/24/21 1001       PT LONG TERM GOAL #1   Title Pt will be ind with advanced HEP to maintain improvements made withPT    Status On-going      PT LONG TERM GOAL #2   Title Pt will be ind with using vaginal trainer at home for maintaining healthy pelvic floor muscle    Status On-going      PT LONG TERM GOAL #3   Title Pt will report 50% less back and hip pain    Status On-going                   Plan - 11/06/21 1635     Clinical Impression Statement Pt did well with internal STM today felt some releases of levators and trasverse peroneus muscles.  Pt was educated more on dilators but PT and pt decided to hold off for now to  see what option is the best and will work on STM in clinic for2 more visits.    Comorbidities kidney issue, low back and hip OA, osteoporosis, a lot of food allergies, IBS    PT Treatment/Interventions ADLs/Self Care Home Management;Biofeedback;Cryotherapy;Electrical Stimulation;Moist Heat;Therapeutic activities;Therapeutic exercise;Neuromuscular re-education;Patient/family education;Manual techniques;Taping;Passive range of motion;Dry needling    PT Next Visit Plan internal STM, try addaday to gluteals and quads,modified down dog    Consulted and Agree with Plan of Care Patient             Patient will benefit from skilled therapeutic intervention in order to improve the following deficits and impairments:  Pain, Postural dysfunction, Increased fascial restricitons, Decreased strength, Decreased coordination, Decreased range of motion, Increased muscle spasms, Difficulty walking, Impaired tone  Visit Diagnosis: Levator spasm  Muscle weakness (generalized)     Problem List Patient Active Problem List   Diagnosis Date Noted   OA (osteoarthritis) of hip 05/01/2021   Age-related osteoporosis without current pathological fracture 05/01/2021   Food intolerance 11/14/2020    GERD (gastroesophageal reflux disease) 11/14/2020   Primary hyperparathyroidism ([place]) 11/14/2020   Degenerative disc disease at L5-S1 level 04/25/2020   Non-seasonal allergic rhinitis due to pollen 03/01/2017    Camillo Flaming Wrenna Saks, PT 11/06/2021, 4:50 PM  [facility] @ [address] [place], [place], [address] Phone: [phone]   Fax:  [phone]  Name: Katherine Ramsey MRN: [id number] Date of Birth: [birth year]   PHYSICAL THERAPY DISCHARGE SUMMARY  Visits from Start of Care: 3  Current functional level related to goals / functional outcomes:   See above goals Remaining deficits: See above   Education / Equipment: HEP   Patient agrees to discharge. Patient goals were not met. Patient is being discharged due to not returning since the last visit.  Gustavus Bryant, PT 02/25/22 10:03 AM

## 2021-11-13 ENCOUNTER — Encounter: Payer: PRIVATE HEALTH INSURANCE | Admitting: Physical Therapy

## 2021-11-20 ENCOUNTER — Encounter: Payer: PRIVATE HEALTH INSURANCE | Admitting: Physical Therapy

## 2021-11-27 ENCOUNTER — Encounter: Payer: PRIVATE HEALTH INSURANCE | Admitting: Physical Therapy

## 2021-12-04 ENCOUNTER — Encounter: Payer: PRIVATE HEALTH INSURANCE | Admitting: Physical Therapy

## 2021-12-08 IMAGING — XA Imaging study
1 series · 1 of 1 positions shown · non-contrast
Comparison: none

CLINICAL DATA: Previous left laminotomy L5-S1. Right lower
extremity pain.

[Series 1: ortho adipose · 1 of 1 slices shown]
[im 1/1]
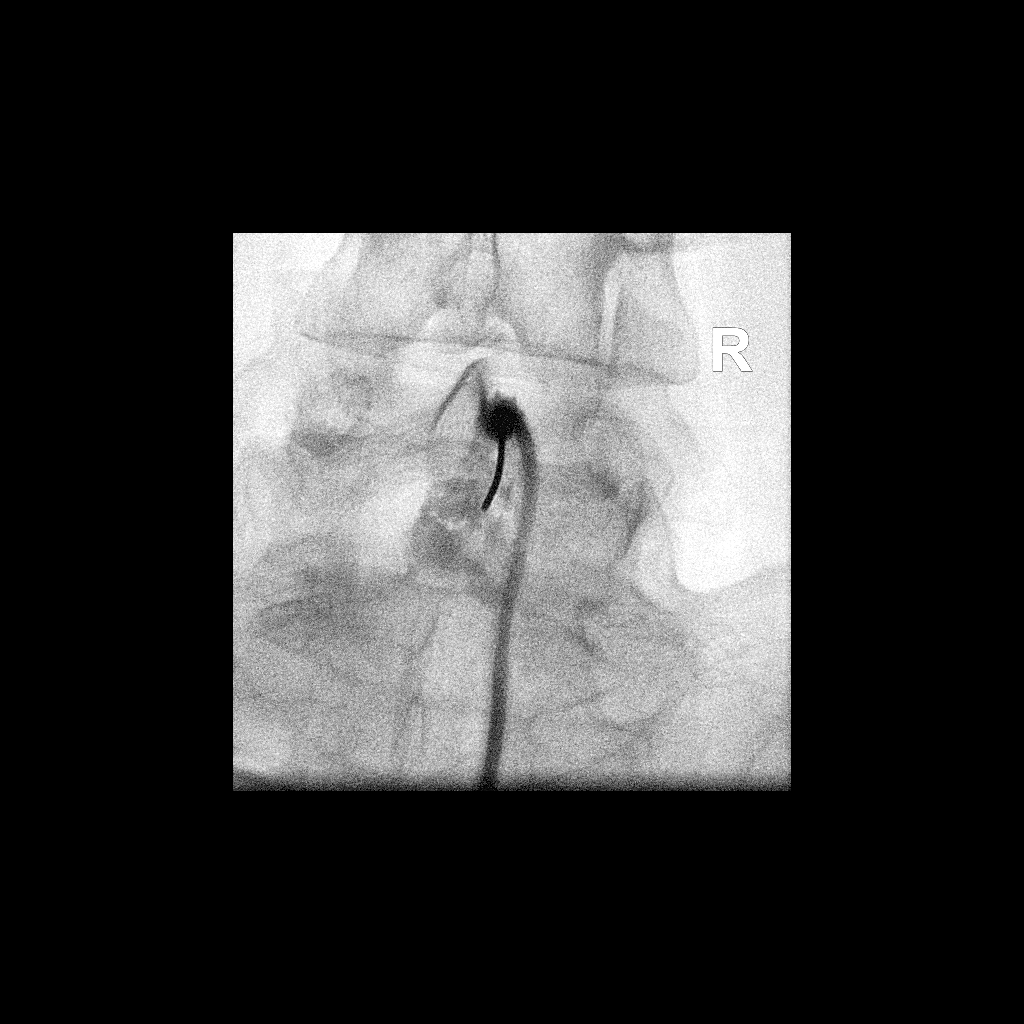

[1 of 1 positions shown; findings below may reference images not displayed]

EXAM:
LUMBAR EPIDURAL INJECTION:

DIAGNOSTIC EPIDURAL INJECTION:

THERAPEUTIC EPIDURAL INJECTION:

PROCEDURE:
The procedure, risks, benefits, and alternatives were explained to
the patient. Questions regarding the procedure were encouraged and
answered. The patient understands and consents to the procedure.

An interlaminar approach was performed on the right at L5-S1. The
overlying skin was cleansed and anesthetized. A 20 gauge epidural
needle was advanced using loss-of-resistance technique.

Injection of Isovue-M 200 shows a good epidural pattern with spread
above and below the level of needle placement, primarily on the
right no vascular opacification is seen.

120mg of Depo-Medrol mixed with 2ml lidocaine 1% were instilled. The
procedure was well-tolerated, and the patient was discharged thirty
minutes following the injection in good condition.

FLUOROSCOPY TIME:  13 seconds; 3.99 uKymX DAP

COMPLICATIONS:
None immediate
IMPRESSION: Technically successful epidural injection on the right at L5-S1.

## 2022-02-10 ENCOUNTER — Ambulatory Visit: Payer: PRIVATE HEALTH INSURANCE | Admitting: Family Medicine

## 2022-02-10 ENCOUNTER — Ambulatory Visit (HOSPITAL_BASED_OUTPATIENT_CLINIC_OR_DEPARTMENT_OTHER)
Admission: RE | Admit: 2022-02-10 | Discharge: 2022-02-10 | Disposition: A | Discharge status: Home / Self Care | Payer: PRIVATE HEALTH INSURANCE | Source: Ambulatory Visit | Attending: Family Medicine | Admitting: Family Medicine

## 2022-02-10 ENCOUNTER — Encounter: Payer: Self-pay | Admitting: Family Medicine

## 2022-02-10 VITALS — BP 94/62 | Ht 65.0 in | Wt 105.0 lb

## 2022-02-10 DIAGNOSIS — R590 Localized enlarged lymph nodes: Secondary | ICD-10-CM | POA: Diagnosis not present

## 2022-02-10 DIAGNOSIS — M5412 Radiculopathy, cervical region: Secondary | ICD-10-CM | POA: Insufficient documentation

## 2022-02-10 NOTE — Assessment & Plan Note (Deleted)
Acute on chronic in nature.  She has previous x-rays dating back to 2005.  This is a long issue for her and still present.  Has tried medications as well as physical therapy.  She has been under greater than 6 weeks of physician directed home exercise therapy.  Has a history of osteoporosis and new concerns of possible compression fracture. -Counseled on home exercise therapy and supportive care. -X-ray. -MRI of the cervical spine to evaluate for nerve impingement as well as for presurgical planning and epidural use.

## 2022-02-10 NOTE — Assessment & Plan Note (Signed)
Acute on chronic in nature.  She has previous x-rays dating back to 2005.  This is a long issue for her and still present.  Has tried medications as well as physical therapy.  She has been under greater than 6 weeks of physician directed home exercise therapy.  Has a history of osteoporosis and new concerns of possible compression fracture. -Counseled on home exercise therapy and supportive care. -X-ray. -MRI of the cervical spine to evaluate for nerve impingement as well as for presurgical planning and epidural use. 

## 2022-02-10 NOTE — Assessment & Plan Note (Signed)
Having shotty lymphadenopathy around the neck.  Normal lab work was appreciated. -Counseled on home exercise therapy and supportive care. -Could consider biopsy.

## 2022-02-10 NOTE — Patient Instructions (Signed)
Good to see you We'll get the xrays today  We'll get the MRI at [facility]   Please look up Prolia and evenity to see which you would like to try Please send me a message in [place] with any questions or updates.  We will setup a virtual visit once the MRI is resulted.   --Dr. Raeford Razor

## 2022-02-10 NOTE — Progress Notes (Signed)
  Katherine Ramsey - 56 y.o. female MRN [id number]  Date of birth: Aug 03, 1966  SUBJECTIVE:  Including CC & ROS.  No chief complaint on file.   Katherine Ramsey is a 56 y.o. female that is presenting with cervical lymphadenopathy as well as acute on chronic neck and radicular type pain.  She has had pain in around her neck since 2005.  She has had previous x-rays as well as treatment with medications and physical therapy.  Recently her acupuncturist noticed lymphadenopathy around her neck.  Lab work has been normal at this time.    Review of Systems See HPI   HISTORY: Past Medical, Surgical, Social, and Family History Reviewed & Updated per EMR.   Pertinent Historical Findings include:  Past Medical History:  Diagnosis Date   Hypercholesterolemia    IBS (irritable bowel syndrome)    Kidney disease     Past Surgical History:  Procedure Laterality Date   BACK SURGERY     CESAREAN SECTION     LITHOTRIPSY Right    SINUS EXPLORATION       PHYSICAL EXAM:  VS: BP 94/62 (BP Location: Left Arm, Patient Position: Sitting)   Ht 5\' 5"  (1.651 m)   Wt 105 lb (47.6 kg)   BMI 17.47 kg/m  Physical Exam Gen: NAD, alert, cooperative with exam, well-appearing MSK:  Neck: Limited extension. Normal lateral rotation. Positive Spurling's test. Neurovascularly intact       ASSESSMENT & PLAN:   Cervical radiculopathy Acute on chronic in nature.  She has previous x-rays dating back to 2005.  This is a long issue for her and still present.  Has tried medications as well as physical therapy.  She has been under greater than 6 weeks of physician directed home exercise therapy.  Has a history of osteoporosis and new concerns of possible compression fracture. -Counseled on home exercise therapy and supportive care. -X-ray. -MRI of the cervical spine to evaluate for nerve impingement as well as for presurgical planning and epidural use.  Cervical lymphadenopathy Having shotty lymphadenopathy around  the neck.  Normal lab work was appreciated. -Counseled on home exercise therapy and supportive care. -Could consider biopsy.

## 2022-02-13 ENCOUNTER — Telehealth: Payer: Self-pay | Admitting: Family Medicine

## 2022-02-13 NOTE — Telephone Encounter (Signed)
Left VM for patient. If she calls back please have her speak with a nurse/CMA and inform that her x-rays are showing degenerative changes that are mild in nature.  We will continue to pursue the MRI to evaluate for nerve impingement.   If any questions then please take the best time and phone number to call and I will try to call her back.   Myra Rude, MD [facility] 02/13/2022, 2:05 PM

## 2022-02-22 ENCOUNTER — Ambulatory Visit (INDEPENDENT_AMBULATORY_CARE_PROVIDER_SITE_OTHER): Payer: PRIVATE HEALTH INSURANCE

## 2022-02-22 DIAGNOSIS — M5412 Radiculopathy, cervical region: Secondary | ICD-10-CM

## 2022-03-11 ENCOUNTER — Telehealth (INDEPENDENT_AMBULATORY_CARE_PROVIDER_SITE_OTHER): Payer: PRIVATE HEALTH INSURANCE | Admitting: Family Medicine

## 2022-03-11 ENCOUNTER — Encounter: Payer: Self-pay | Admitting: Family Medicine

## 2022-03-11 DIAGNOSIS — R590 Localized enlarged lymph nodes: Secondary | ICD-10-CM

## 2022-03-11 DIAGNOSIS — M5412 Radiculopathy, cervical region: Secondary | ICD-10-CM

## 2022-03-11 NOTE — Assessment & Plan Note (Signed)
MRI was demonstrating areas of neuroforaminal stenosis.  Likely the source of her symptoms.  She would like to hold off on an epidural at this point. -Counseled on home exercise therapy and supportive care. -Could consider physical therapy

## 2022-03-11 NOTE — Progress Notes (Signed)
Virtual Visit via Video Note  I connected with Katherine Ramsey on 03/11/22 at  8:00 AM EDT by a video enabled telemedicine application and verified that I am speaking with the correct person using two identifiers.  Location: Patient: home Provider: office   I discussed the limitations of evaluation and management by telemedicine and the availability of in person appointments. The patient expressed understanding and agreed to proceed.  History of Present Illness:  Katherine Ramsey is a 56 year old female that is following up after the MRI for cervical spine.  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  She continues to have dysphagia and anterior cervical lymphadenopathy that is the main concern for her today.  Observations/Objective:   Assessment and Plan:  Cervical radiculopathy: MRI was demonstrating areas of neuroforaminal stenosis.  Likely the source of her symptoms.  She would like to hold off on an epidural at this point. -Counseled on home exercise therapy and supportive care. -Could consider physical therapy  Cervical lymphadenopathy: Anterior in nature.  MRI was not demonstrating any postauricular or postoccipital lymph nodes.  She is having dysphagia as well. -Referral to ear nose and throat    Follow Up Instructions:    I discussed the assessment and treatment plan with the patient. The patient was provided an opportunity to ask questions and all were answered. The patient agreed with the plan and demonstrated an understanding of the instructions.   The patient was advised to call back or seek an in-person evaluation if the symptoms worsen or if the condition fails to improve as anticipated.   Clare Gandy, MD

## 2022-03-11 NOTE — Assessment & Plan Note (Signed)
Anterior in nature.  MRI was not demonstrating any postauricular or postoccipital lymph nodes.  She is having dysphagia as well. -Referral to ear nose and throat

## 2022-04-01 ENCOUNTER — Other Ambulatory Visit: Payer: Self-pay | Admitting: *Deleted

## 2022-04-01 DIAGNOSIS — I8393 Asymptomatic varicose veins of bilateral lower extremities: Secondary | ICD-10-CM

## 2022-04-21 ENCOUNTER — Ambulatory Visit (INDEPENDENT_AMBULATORY_CARE_PROVIDER_SITE_OTHER): Payer: PRIVATE HEALTH INSURANCE | Admitting: Vascular Surgery

## 2022-04-21 ENCOUNTER — Encounter: Payer: Self-pay | Admitting: Vascular Surgery

## 2022-04-21 ENCOUNTER — Ambulatory Visit (HOSPITAL_COMMUNITY)
Admission: RE | Admit: 2022-04-21 | Discharge: 2022-04-21 | Disposition: A | Discharge status: Home / Self Care | Payer: PRIVATE HEALTH INSURANCE | Source: Ambulatory Visit | Attending: Vascular Surgery | Admitting: Vascular Surgery

## 2022-04-21 DIAGNOSIS — I8390 Asymptomatic varicose veins of unspecified lower extremity: Secondary | ICD-10-CM

## 2022-04-21 DIAGNOSIS — I8393 Asymptomatic varicose veins of bilateral lower extremities: Secondary | ICD-10-CM | POA: Diagnosis present

## 2022-04-21 NOTE — Progress Notes (Signed)
Patient name: Brandalynn Ofallon MRN: [id number] DOB: [birth year] Sex: female  REASON FOR CONSULT: Spider veins  HPI: Frederick Klinger is a 56 y.o. female, with history of hyperlipidemia and irritable bowel syndrome that presents for evaluation of lower extremity spider veins.  Sounds like she has been evaluated by [facility] in the past and was told to wear compression stockings.  She does not feel that these have been working and has been having cramping in her lower extremities.  Sounds like her toes curl up when she gets cramping.  This is not necessarily exercise-induced.  No history of DVT.  No previous vascular interventions.  Past Medical History:  Diagnosis Date   Hypercholesterolemia    IBS (irritable bowel syndrome)    Kidney disease     Past Surgical History:  Procedure Laterality Date   BACK SURGERY     CESAREAN SECTION     LITHOTRIPSY Right    SINUS EXPLORATION      Family History  Problem Relation Age of Onset   Colon cancer Father     SOCIAL HISTORY: Social History   Socioeconomic History   Marital status: Married    Spouse name: Not on file   Number of children: Not on file   Years of education: Not on file   Highest education level: Not on file  Occupational History   Not on file  Tobacco Use   Smoking status: Never    Passive exposure: Never   Smokeless tobacco: Never  Vaping Use   Vaping Use: Never used  Substance and Sexual Activity   Alcohol use: Never   Drug use: Never   Sexual activity: Not Currently    Birth control/protection: None  Other Topics Concern   Not on file  Social History Narrative   Not on file   Social Determinants of Health   Financial Resource Strain: Not on file  Food Insecurity: Not on file  Transportation Needs: Not on file  Physical Activity: Not on file  Stress: Not on file  Social Connections: Not on file  Intimate Partner Violence: Not on file    Allergies  Allergen Reactions   Dairycare  [Lactase-Lactobacillus] Shortness Of Breath and Other (See Comments)    GI issues   Eggs Or Egg-Derived Products Shortness Of Breath and Other (See Comments)    Abd Pain, GI issues   Gluten Meal Shortness Of Breath and Other (See Comments)    GI issues   Other    Shellfish Allergy Rash    Current Outpatient Medications  Medication Sig Dispense Refill   estradiol (ESTRACE) 0.1 MG/GM vaginal cream Apply 1 gram per vagina every night for 2 weeks, then apply three times a week 42.5 g 3   No current facility-administered medications for this visit.    REVIEW OF SYSTEMS:  [X]  denotes positive finding, [ ]  denotes negative finding Cardiac  Comments:  Chest pain or chest pressure:    Shortness of breath upon exertion:    Short of breath when lying flat:    Irregular heart rhythm:        Vascular    Pain in calf, thigh, or hip brought on by ambulation:    Pain in feet at night that wakes you up from your sleep:     Blood clot in your veins:    Leg swelling:         Pulmonary    Oxygen at home:    Productive cough:  Wheezing:         Neurologic    Sudden weakness in arms or legs:     Sudden numbness in arms or legs:     Sudden onset of difficulty speaking or slurred speech:    Temporary loss of vision in one eye:     Problems with dizziness:         Gastrointestinal    Blood in stool:     Vomited blood:         Genitourinary    Burning when urinating:     Blood in urine:        Psychiatric    Major depression:         Hematologic    Bleeding problems:    Problems with blood clotting too easily:        Skin    Rashes or ulcers:        Constitutional    Fever or chills:      PHYSICAL EXAM: Vitals:   04/21/22 1152  BP: 113/73  Pulse: 60  Resp: 14  Temp: 98 F (36.7 C)  TempSrc: Temporal  SpO2: 96%  Weight: 106 lb (48.1 kg)  Height: 5\' 5"  (1.651 m)    GENERAL: The patient is a well-nourished female, in no acute distress. The vital signs are  documented above. CARDIAC: There is a regular rate and rhythm.  VASCULAR:  Palpable femoral pulses bilaterally Palpable popliteal pulses bilaterally Palpable DP PT pulses bilaterally A few lower extremity spider veins PULMONARY: No respiratory distress. ABDOMEN: Soft and non-tender. MUSCULOSKELETAL: There are no major deformities or cyanosis. NEUROLOGIC: No focal weakness or paresthesias are detected. SKIN: There are no ulcers or rashes noted. PSYCHIATRIC: The patient has a normal affect.  DATA:   Indications: Itching and telangiectasias of the right leg.     Performing Technologist: RVS, RCS      Examination Guidelines: A complete evaluation includes B-mode imaging,  spectral  Doppler, color Doppler, and power Doppler as needed of all accessible  portions  of each vessel. Bilateral testing is considered an integral part of a  complete  examination. Limited examinations for reoccurring indications may be  performed  as noted. The reflux portion of the exam is performed with the patient in  reverse Trendelenburg.  Significant venous reflux is defined as >500 ms in the superficial venous  system, and >1 second in the deep venous system.      Venous Reflux Times  +--------------+---------+------+-----------+------------+--------+  RIGHT         Reflux NoRefluxReflux TimeDiameter cmsComments                          Yes                                   +--------------+---------+------+-----------+------------+--------+  CFV           no                                              +--------------+---------+------+-----------+------------+--------+  FV mid        no                                              +--------------+---------+------+-----------+------------+--------+  Popliteal     no                                              +--------------+---------+------+-----------+------------+--------+  GSV at [facility]    no                             0.45              +--------------+---------+------+-----------+------------+--------+  GSV prox thighno                            0.32              +--------------+---------+------+-----------+------------+--------+  GSV mid thigh no                            0.34              +--------------+---------+------+-----------+------------+--------+  GSV dist thighno                            0.19              +--------------+---------+------+-----------+------------+--------+  GSV at knee   no                            0.23              +--------------+---------+------+-----------+------------+--------+  GSV prox calf no                            0.23              +--------------+---------+------+-----------+------------+--------+  SSV Pop Fossa no                            0.14              +--------------+---------+------+-----------+------------+--------+         Summary:  Right:  - No evidence of deep vein thrombosis from the common femoral through the  popliteal veins.  - No evidence of superficial venous thrombosis.  - The deep venous system is competent.  - The great and small saphenous veins are competent.     *See table(s) above for measurements and observations.   Assessment/Plan:  56 year old female referred for evaluation of lower extremity spider veins and cramping.  I discussed that her reflux study today shows no evidence of DVT and that her valves are all competent with no evidence of lower extremity venous reflux in the right  leg.  On exam, I see no large varicosities.  She has a couple small spider veins that would really be only amendable to sclerotherapy from a cosmetic standpoint.  She is really not interested which is understandable.  I discussed I do not think there is any evidence of arterial insufficiency to explain her cramping.  She has palpable pedal pulses on exam.  She can follow-up  with me as needed.   Cephus Shelling, MD [facility] Office: [phone]

## 2022-09-04 ENCOUNTER — Telehealth: Payer: Self-pay

## 2022-09-04 NOTE — Telephone Encounter (Signed)
Patient came into office to make an appointment, and to drop off her Preoperative form for surgery, patient is scheduled for 09/16/22 with Dr. Molli Hazard, forms placed in Dr. Ashley Royalty box, thanks.

## 2022-09-16 ENCOUNTER — Ambulatory Visit (INDEPENDENT_AMBULATORY_CARE_PROVIDER_SITE_OTHER): Payer: PRIVATE HEALTH INSURANCE | Admitting: Family Medicine

## 2022-09-16 ENCOUNTER — Encounter: Payer: Self-pay | Admitting: Family Medicine

## 2022-09-16 VITALS — BP 106/69 | HR 56 | Ht 65.0 in | Wt 117.0 lb

## 2022-09-16 DIAGNOSIS — Z01818 Encounter for other preprocedural examination: Secondary | ICD-10-CM

## 2022-09-16 NOTE — Progress Notes (Signed)
Katherine Ramsey - 57 y.o. female MRN [id number]  Date of birth: 08/25/1966  Subjective Chief Complaint  Patient presents with   Pre-op Exam    HPI Katherine Ramsey is a 57 y.o. female here today for pre-op evaluation.   She is planning on having a R THA with Dr. Alvan Dame.  She has been in good health.  No prior history of HTN, heart disease or pulmonary disease.  Spinal anesthesia is planned.  She has not had any previous issues with anesthesia.  She denies chest pain, palpitations, headache.  No prior history of diabetes.  She is not on any chronic medications at this time.   ROS:  A comprehensive ROS was completed and negative except as noted per HPI  Allergies  Allergen Reactions   Dairycare [Lactase-Lactobacillus] Shortness Of Breath and Other (See Comments)    GI issues   Eggs Or Egg-Derived Products Shortness Of Breath and Other (See Comments)    Abd Pain, GI issues   Gluten Meal Shortness Of Breath and Other (See Comments)    GI issues   Other    Shellfish Allergy Rash    Past Medical History:  Diagnosis Date   Hypercholesterolemia    IBS (irritable bowel syndrome)    Kidney disease     Past Surgical History:  Procedure Laterality Date   BACK SURGERY     CESAREAN SECTION     LITHOTRIPSY Right    SINUS EXPLORATION      Social History   Socioeconomic History   Marital status: Married    Spouse name: Not on file   Number of children: Not on file   Years of education: Not on file   Highest education level: Not on file  Occupational History   Not on file  Tobacco Use   Smoking status: Never    Passive exposure: Never   Smokeless tobacco: Never  Vaping Use   Vaping Use: Never used  Substance and Sexual Activity   Alcohol use: Never   Drug use: Never   Sexual activity: Not Currently    Birth control/protection: None  Other Topics Concern   Not on file  Social History Narrative   Not on file   Social Determinants of Health   Financial Resource Strain: Not on  file  Food Insecurity: Not on file  Transportation Needs: Not on file  Physical Activity: Not on file  Stress: Not on file  Social Connections: Not on file    Family History  Problem Relation Age of Onset   Colon cancer Father     Health Maintenance  Topic Date Due   DTaP/Tdap/Td (1 - Tdap) Never done   MAMMOGRAM  Never done   INFLUENZA VACCINE  12/06/2022 (Originally 04/07/2022)   Hepatitis C Screening  09/17/2023 (Originally 12/19/1981)   HIV Screening  09/17/2023 (Originally 12/20/1978)   COVID-19 Vaccine (5 - 2023-24 season) 10/03/2023 (Originally 05/08/2022)   Zoster Vaccines- Shingrix (1 of 2) 12/16/2023 (Originally 12/19/2013)   COLONOSCOPY (Pts 45-35yrs Insurance coverage will need to be confirmed)  11/06/2022   PAP SMEAR-Modifier  10/02/2024   HPV VACCINES  Aged Out     ----------------------------------------------------------------------------------------------------------------------------------------------------------------------------------------------------------------- Physical Exam BP 106/69 (BP Location: Left Arm, Patient Position: Sitting, Cuff Size: Small)   Pulse (!) 56   Ht 5\' 5"  (1.651 m)   Wt 117 lb (53.1 kg)   SpO2 98%   BMI 19.47 kg/m   Physical Exam Constitutional:      Appearance: Normal appearance.  HENT:  Head: Normocephalic and atraumatic.  Eyes:     General: No scleral icterus. Cardiovascular:     Rate and Rhythm: Normal rate and regular rhythm.  Pulmonary:     Effort: Pulmonary effort is normal.     Breath sounds: Normal breath sounds.  Musculoskeletal:     Cervical back: Neck supple.  Neurological:     General: No focal deficit present.     Mental Status: She is alert.  Psychiatric:        Mood and Affect: Mood normal.        Behavior: Behavior normal.      ------------------------------------------------------------------------------------------------------------------------------------------------------------------------------------------------------------------- Assessment and Plan  Pre-op evaluation Cleared for THA.  She is low risk for cardiac complications.  Clearance form sent to Dr. Aurea Graff office.    No orders of the defined types were placed in this encounter.   No follow-ups on file.    This visit occurred during the SARS-CoV-2 public health emergency.  Safety protocols were in place, including screening questions prior to the visit, additional usage of staff PPE, and extensive cleaning of exam room while observing appropriate contact time as indicated for disinfecting solutions.

## 2022-09-16 NOTE — Assessment & Plan Note (Signed)
Cleared for THA.  She is low risk for cardiac complications.  Clearance form sent to Dr. Aurea Graff office.

## 2022-10-19 ENCOUNTER — Ambulatory Visit (INDEPENDENT_AMBULATORY_CARE_PROVIDER_SITE_OTHER): Payer: PRIVATE HEALTH INSURANCE | Admitting: Podiatry

## 2022-10-19 ENCOUNTER — Encounter: Payer: Self-pay | Admitting: Podiatry

## 2022-10-19 ENCOUNTER — Ambulatory Visit (INDEPENDENT_AMBULATORY_CARE_PROVIDER_SITE_OTHER): Payer: PRIVATE HEALTH INSURANCE

## 2022-10-19 DIAGNOSIS — M7741 Metatarsalgia, right foot: Secondary | ICD-10-CM

## 2022-10-19 DIAGNOSIS — M7742 Metatarsalgia, left foot: Secondary | ICD-10-CM | POA: Diagnosis not present

## 2022-10-19 DIAGNOSIS — G5762 Lesion of plantar nerve, left lower limb: Secondary | ICD-10-CM

## 2022-10-19 DIAGNOSIS — G5761 Lesion of plantar nerve, right lower limb: Secondary | ICD-10-CM

## 2022-10-19 DIAGNOSIS — M5416 Radiculopathy, lumbar region: Secondary | ICD-10-CM

## 2022-10-19 MED ORDER — DEXAMETHASONE SODIUM PHOSPHATE 120 MG/30ML IJ SOLN: 4.0000 mg | Freq: Once | INTRAMUSCULAR | Status: DC

## 2022-10-19 NOTE — Progress Notes (Signed)
  Subjective:  Patient ID: Katherine Ramsey, female    DOB: Feb 02, 1966,   MRN: [id number]  Chief Complaint  Patient presents with   Foot Pain    Bil foot pain and numbness. Tender to the touch. Burning sensation. Ongoing for awhile. Has been worst the last 2 or 3 months. Patient states that she has tried massages and acupuncture.     57 y.o. female presents for concern of bilateral foot pain and numbness. Relates started 4 years ago after dropping something on her foot. Since then it has gotten better and worse. Recenty had more issues with back and started getting more numbness and burning in her feet particularly the right. Denies any current treatments.  . Denies any other pedal complaints. Denies n/v/f/c.   Past Medical History:  Diagnosis Date   Hypercholesterolemia    IBS (irritable bowel syndrome)    Kidney disease     Objective:  Physical Exam: Vascular: DP/PT pulses 2/4 bilateral. CFT <3 seconds. Normal hair growth on digits. No edema.  Skin. No lacerations or abrasions bilateral feet.  Musculoskeletal: MMT 5/5 bilateral lower extremities in DF, PF, Inversion and Eversion. Deceased ROM in DF of ankle joint.  Tender on the right to the third interspace with positive mulders click on the right. No tenderness on the left.  Neurological: Sensation intact to light touch.   Assessment:   1. Metatarsalgia of both feet   2. Morton's neuroma, right   3. Lumbar radiculopathy   4. Morton's neuroma, left      Plan:  Patient was evaluated and treated and all questions answered. Discussed neuroma in setting with radiculopathy.  and treatment options with patient.  Radiographs reviewed and discussed with patient.  Injection offered today. Patient in agreement. Procedure below.  Discussed padding and offloading today.  Deferred anti-inflammatories.  Discussed if pain does not improve may consider  MRI for further surgical planning.  Patient to return in 6 weeks or sooner if concerns  arise.    Procedure: Injection Tendon/Ligament Discussed alternatives, risks, complications and verbal consent was obtained.  Location: Bilateral third interspace. Skin Prep: Alcohol. Injectate: 1cc 0.5% marcaine plain, 1 cc dexamethasone.  Disposition: Patient tolerated procedure well. Injection site dressed with a band-aid.  Post-injection care was discussed and return precautions discussed.     Lorenda Peck, DPM

## 2022-11-18 ENCOUNTER — Encounter: Payer: Self-pay | Admitting: Family Medicine

## 2022-11-20 ENCOUNTER — Encounter: Payer: Self-pay | Admitting: Family Medicine

## 2022-11-20 ENCOUNTER — Ambulatory Visit (INDEPENDENT_AMBULATORY_CARE_PROVIDER_SITE_OTHER): Payer: PRIVATE HEALTH INSURANCE | Admitting: Family Medicine

## 2022-11-20 VITALS — BP 103/66 | HR 55 | Ht 65.0 in | Wt 118.0 lb

## 2022-11-20 DIAGNOSIS — E210 Primary hyperparathyroidism: Secondary | ICD-10-CM

## 2022-11-20 DIAGNOSIS — L299 Pruritus, unspecified: Secondary | ICD-10-CM

## 2022-11-20 DIAGNOSIS — Z13220 Encounter for screening for lipoid disorders: Secondary | ICD-10-CM

## 2022-11-22 DIAGNOSIS — L299 Pruritus, unspecified: Secondary | ICD-10-CM | POA: Insufficient documentation

## 2022-11-22 NOTE — Assessment & Plan Note (Signed)
Unclear etiology.  No rash present.  Updating labs today. Orders Placed This Encounter  Procedures   COMPLETE METABOLIC PANEL WITH GFR   Lipid Panel w/reflex Direct LDL   TSH   B12   Vitamin B6   Vitamin D (25 hydroxy)

## 2022-11-22 NOTE — Progress Notes (Signed)
Katherine Ramsey - 57 y.o. female MRN [id number]  Date of birth: 21-Jan-1966  Subjective No chief complaint on file.   HPI Katherine Ramsey is a 57 year old female here today for follow-up.  She reports she has had pruritus for a few months.  Symptoms come and go.  Has not had any rash associated with this.  She has not really tried anything other than topical moisturizer.  She has not changed anything in her diet, soaps, detergents.  No new medications or supplements at this time.  She is wanting numerous labs checked today.  ROS:  A comprehensive ROS was completed and negative except as noted per HPI  Allergies  Allergen Reactions   Dairycare [Lactase-Lactobacillus] Shortness Of Breath and Other (See Comments)    GI issues   Egg-Derived Products Shortness Of Breath and Other (See Comments)    Abd Pain, GI issues   Gluten Meal Shortness Of Breath and Other (See Comments)    GI issues   Other    Shellfish Allergy Rash    Past Medical History:  Diagnosis Date   Hypercholesterolemia    IBS (irritable bowel syndrome)    Kidney disease     Past Surgical History:  Procedure Laterality Date   BACK SURGERY     CESAREAN SECTION     LITHOTRIPSY Right    SINUS EXPLORATION      Social History   Socioeconomic History   Marital status: Married    Spouse name: Not on file   Number of children: Not on file   Years of education: Not on file   Highest education level: Not on file  Occupational History   Not on file  Tobacco Use   Smoking status: Never    Passive exposure: Never   Smokeless tobacco: Never  Vaping Use   Vaping Use: Never used  Substance and Sexual Activity   Alcohol use: Never   Drug use: Never   Sexual activity: Not Currently    Birth control/protection: None  Other Topics Concern   Not on file  Social History Narrative   Not on file   Social Determinants of Health   Financial Resource Strain: Not on file  Food Insecurity: Not on file  Transportation Needs:  Not on file  Physical Activity: Not on file  Stress: Not on file  Social Connections: Not on file    Family History  Problem Relation Age of Onset   Colon cancer Father     Health Maintenance  Topic Date Due   DTaP/Tdap/Td (1 - Tdap) Never done   INFLUENZA VACCINE  12/06/2022 (Originally 04/07/2022)   Hepatitis C Screening  09/17/2023 (Originally 12/19/1981)   HIV Screening  09/17/2023 (Originally 12/20/1978)   COVID-19 Vaccine (5 - 2023-24 season) 10/03/2023 (Originally 05/08/2022)   MAMMOGRAM  11/20/2023 (Originally 12/19/2013)   COLONOSCOPY (Pts 45-69yrs Insurance coverage will need to be confirmed)  11/20/2023 (Originally 11/06/2022)   Zoster Vaccines- Shingrix (1 of 2) 12/16/2023 (Originally 12/19/2013)   PAP SMEAR-Modifier  10/02/2024   HPV VACCINES  Aged Out     ----------------------------------------------------------------------------------------------------------------------------------------------------------------------------------------------------------------- Physical Exam BP 103/66 (BP Location: Left Arm, Patient Position: Sitting, Cuff Size: Small)   Pulse (!) 55   Ht 5\' 5"  (1.651 m)   Wt 118 lb (53.5 kg)   SpO2 100%   BMI 19.64 kg/m   Physical Exam Constitutional:      Appearance: Normal appearance.  HENT:     Head: Normocephalic and atraumatic.  Eyes:     General: No  scleral icterus. Cardiovascular:     Rate and Rhythm: Normal rate and regular rhythm.  Pulmonary:     Effort: Pulmonary effort is normal.     Breath sounds: Normal breath sounds.  Musculoskeletal:     Cervical back: Neck supple.  Skin:    General: Skin is warm and dry.     Findings: No rash.  Neurological:     General: No focal deficit present.     Mental Status: She is alert.  Psychiatric:        Mood and Affect: Mood normal.        Behavior: Behavior normal.      ------------------------------------------------------------------------------------------------------------------------------------------------------------------------------------------------------------------- Assessment and Plan  Pruritus Unclear etiology.  No rash present.  Updating labs today. Orders Placed This Encounter  Procedures   COMPLETE METABOLIC PANEL WITH GFR   Lipid Panel w/reflex Direct LDL   TSH   B12   Vitamin B6   Vitamin D (25 hydroxy)     No orders of the defined types were placed in this encounter.   No follow-ups on file.    This visit occurred during the SARS-CoV-2 public health emergency.  Safety protocols were in place, including screening questions prior to the visit, additional usage of staff PPE, and extensive cleaning of exam room while observing appropriate contact time as indicated for disinfecting solutions.

## 2022-11-24 LAB — LIPID PANEL W/REFLEX DIRECT LDL
Cholesterol: 225 mg/dL — ABNORMAL HIGH (ref <200)
HDL: 96 mg/dL (ref >=50)
LDL Cholesterol (Calc): 116 mg/dL (calc) — ABNORMAL HIGH
Non-HDL Cholesterol (Calc): 129 mg/dL (calc) (ref <130)
Total CHOL/HDL Ratio: 2.3 (calc) (ref <5.0)
Triglycerides: 50 mg/dL (ref <150)

## 2022-11-24 LAB — COMPLETE METABOLIC PANEL WITH GFR
AG Ratio: 1.4 (calc) (ref 1.0–2.5)
ALT: 12 U/L (ref 6–29)
AST: 18 U/L (ref 10–35)
Albumin: 4.2 g/dL (ref 3.6–5.1)
Alkaline phosphatase (APISO): 67 U/L (ref 37–153)
BUN: 20 mg/dL (ref 7–25)
CO2: 30 mmol/L (ref 20–32)
Calcium: 9.4 mg/dL (ref 8.6–10.4)
Chloride: 102 mmol/L (ref 98–110)
Creat: 0.6 mg/dL (ref 0.50–1.03)
Globulin: 2.9 g/dL (calc) (ref 1.9–3.7)
Glucose, Bld: 72 mg/dL (ref 65–99)
Potassium: 4.8 mmol/L (ref 3.5–5.3)
Sodium: 140 mmol/L (ref 135–146)
Total Bilirubin: 0.5 mg/dL (ref 0.2–1.2)
Total Protein: 7.1 g/dL (ref 6.1–8.1)
eGFR: 104 mL/min/{1.73_m2} (ref >=60)

## 2022-11-24 LAB — VITAMIN B6: Vitamin B6: 83.8 ng/mL — ABNORMAL HIGH (ref 2.1–21.7)

## 2022-11-24 LAB — VITAMIN D 25 HYDROXY (VIT D DEFICIENCY, FRACTURES): Vit D, 25-Hydroxy: 30 ng/mL (ref 30–100)

## 2022-11-24 LAB — TSH: TSH: 1.61 mIU/L (ref 0.40–4.50)

## 2022-11-24 LAB — VITAMIN B12: Vitamin B-12: 290 pg/mL (ref 200–1100)

## 2022-11-25 LAB — HM MAMMOGRAPHY

## 2022-12-10 ENCOUNTER — Encounter: Payer: Self-pay | Admitting: Podiatry

## 2022-12-10 ENCOUNTER — Ambulatory Visit (INDEPENDENT_AMBULATORY_CARE_PROVIDER_SITE_OTHER): Payer: PRIVATE HEALTH INSURANCE | Admitting: Podiatry

## 2022-12-10 DIAGNOSIS — M7741 Metatarsalgia, right foot: Secondary | ICD-10-CM

## 2022-12-10 DIAGNOSIS — M7742 Metatarsalgia, left foot: Secondary | ICD-10-CM

## 2022-12-10 DIAGNOSIS — G5762 Lesion of plantar nerve, left lower limb: Secondary | ICD-10-CM

## 2022-12-10 DIAGNOSIS — G5761 Lesion of plantar nerve, right lower limb: Secondary | ICD-10-CM

## 2022-12-10 NOTE — Progress Notes (Signed)
  Subjective:  Patient ID: Katherine Ramsey, female    DOB: 1966/07/11,   MRN: [id number]  Chief Complaint  Patient presents with   Neuroma     Bilateral neuroma Patient states pain has gotten better some.But her feet still bother her. Patient states she has to wear a wide shoe to actually feel any relief.    57 y.o. female presents for follow-up of bilateral neuroma pain. Relates pain has improved but still has some and relates can only wear wide shoes.   Recenty had more issues with back and started getting more numbness and burning in her feet particularly the right. Denies any current treatments.  . Denies any other pedal complaints. Denies n/v/f/c.   Past Medical History:  Diagnosis Date   Hypercholesterolemia    IBS (irritable bowel syndrome)    Kidney disease     Objective:  Physical Exam: Vascular: DP/PT pulses 2/4 bilateral. CFT <3 seconds. Normal hair growth on digits. No edema.  Skin. No lacerations or abrasions bilateral feet.  Musculoskeletal: MMT 5/5 bilateral lower extremities in DF, PF, Inversion and Eversion. Deceased ROM in DF of ankle joint.  Tender on the right to the third interspace with positive mulders click on the right. No tenderness on the left.  Neurological: Sensation intact to light touch.   Assessment:   1. Morton's neuroma, right   2. Metatarsalgia of both feet   3. Morton's neuroma, left       Plan:  Patient was evaluated and treated and all questions answered. Discussed neuroma in setting with radiculopathy.  and treatment options with patient.  Radiographs reviewed and discussed with patient.  Deferred injection today  Discussed padding and offloading today.  Deferred anti-inflammatories.  Discussed next steps including MRI and NCV/EMG. Discussed possible need for surgery and discussed perioperative course in detail.  MRI ordered bilateral NCV/EMG bilateral to assess possible level of neuropathy vs radiculopathy Patient to return in 6 weeks  or sooner if concerns arise.      Lorenda Peck, DPM

## 2022-12-21 ENCOUNTER — Encounter: Payer: Self-pay | Admitting: *Deleted

## 2023-01-03 ENCOUNTER — Ambulatory Visit
Admission: RE | Admit: 2023-01-03 | Discharge: 2023-01-03 | Disposition: A | Discharge status: Home / Self Care | Payer: PRIVATE HEALTH INSURANCE | Source: Ambulatory Visit | Attending: Podiatry | Admitting: Podiatry

## 2023-01-03 ENCOUNTER — Ambulatory Visit
Admission: RE | Admit: 2023-01-03 | Discharge: 2023-01-03 | Disposition: A | Discharge status: Home / Self Care | Payer: PRIVATE HEALTH INSURANCE | Source: Ambulatory Visit | Attending: Podiatry

## 2023-01-03 DIAGNOSIS — G5761 Lesion of plantar nerve, right lower limb: Secondary | ICD-10-CM

## 2023-01-03 DIAGNOSIS — G5762 Lesion of plantar nerve, left lower limb: Secondary | ICD-10-CM

## 2023-01-03 DIAGNOSIS — M7741 Metatarsalgia, right foot: Secondary | ICD-10-CM

## 2023-01-29 ENCOUNTER — Encounter: Payer: Self-pay | Admitting: Family Medicine

## 2023-09-17 ENCOUNTER — Encounter: Payer: Self-pay | Admitting: Family Medicine

## 2023-09-17 ENCOUNTER — Ambulatory Visit (INDEPENDENT_AMBULATORY_CARE_PROVIDER_SITE_OTHER): Payer: PRIVATE HEALTH INSURANCE | Admitting: Family Medicine

## 2023-09-17 ENCOUNTER — Ambulatory Visit (INDEPENDENT_AMBULATORY_CARE_PROVIDER_SITE_OTHER): Payer: PRIVATE HEALTH INSURANCE

## 2023-09-17 VITALS — BP 95/60 | HR 52 | Ht 65.0 in | Wt 120.0 lb

## 2023-09-17 DIAGNOSIS — E538 Deficiency of other specified B group vitamins: Secondary | ICD-10-CM | POA: Diagnosis not present

## 2023-09-17 DIAGNOSIS — Z0000 Encounter for general adult medical examination without abnormal findings: Secondary | ICD-10-CM | POA: Insufficient documentation

## 2023-09-17 DIAGNOSIS — Z1211 Encounter for screening for malignant neoplasm of colon: Secondary | ICD-10-CM

## 2023-09-17 DIAGNOSIS — H814 Vertigo of central origin: Secondary | ICD-10-CM | POA: Insufficient documentation

## 2023-09-17 DIAGNOSIS — R0600 Dyspnea, unspecified: Secondary | ICD-10-CM | POA: Diagnosis not present

## 2023-09-17 DIAGNOSIS — E210 Primary hyperparathyroidism: Secondary | ICD-10-CM

## 2023-09-17 DIAGNOSIS — Z13220 Encounter for screening for lipoid disorders: Secondary | ICD-10-CM

## 2023-09-17 NOTE — Assessment & Plan Note (Signed)
 Well adult Orders Placed This Encounter  Procedures   CT ANGIO HEAD NECK W WO CM    Standing Status:   Future    Expiration Date:   09/16/2024    If indicated for the ordered procedure, I authorize the administration of contrast media per Radiology protocol:   Yes    Does the patient have a contrast media/X-ray dye allergy?:   No    Is patient pregnant?:   No    Preferred imaging location?:   [facility]   DG Chest 2 View    Standing Status:   Future    Expiration Date:   09/16/2024    Reason for Exam (SYMPTOM  OR DIAGNOSIS REQUIRED):   Cough and dyspnea    Is patient pregnant?:   No    Preferred imaging location?:   [facility]   CMP14+EGFR   CBC with Differential/Platelet   Lipid Panel With LDL/HDL Ratio   Vitamin D  (25 hydroxy)   Parathyroid  hormone, intact (no Ca)   TSH   B12 and Folate Panel   Ambulatory referral to Gastroenterology    Referral Priority:   Routine    Referral Type:   Consultation    Referral Reason:   Specialty Services Required    Number of Visits Requested:   1  Screenings: per lab orders Immunizations:  Declines.  Anticipatory guidance/Risk factor reduction:  Recommendations per AVS.

## 2023-09-17 NOTE — Assessment & Plan Note (Signed)
 Symptoms concerning as they occur with manipulation of neck and extension of neck.  CT angio of the head and neck ordered.

## 2023-09-17 NOTE — Patient Instructions (Signed)
 Preventive Care 58-60 Years Old, Female  Preventive care refers to lifestyle choices and visits with your health care provider that can promote health and wellness. Preventive care visits are also called wellness exams.  What can I expect for my preventive care visit?  Counseling  Your health care provider may ask you questions about your:  Medical history, including:  Past medical problems.  Family medical history.  Pregnancy history.  Current health, including:  Menstrual cycle.  Method of birth control.  Emotional well-being.  Home life and relationship well-being.  Sexual activity and sexual health.  Lifestyle, including:  Alcohol, nicotine or tobacco, and drug use.  Access to firearms.  Diet, exercise, and sleep habits.  Work and work Astronomer.  Sunscreen use.  Safety issues such as seatbelt and bike helmet use.  Physical exam  Your health care provider will check your:  Height and weight. These may be used to calculate your BMI (body mass index). BMI is a measurement that tells if you are at a healthy weight.  Waist circumference. This measures the distance around your waistline. This measurement also tells if you are at a healthy weight and may help predict your risk of certain diseases, such as type 2 diabetes and high blood pressure.  Heart rate and blood pressure.  Body temperature.  Skin for abnormal spots.  What immunizations do I need?    Vaccines are usually given at various ages, according to a schedule. Your health care provider will recommend vaccines for you based on your age, medical history, and lifestyle or other factors, such as travel or where you work.  What tests do I need?  Screening  Your health care provider may recommend screening tests for certain conditions. This may include:  Lipid and cholesterol levels.  Diabetes screening. This is done by checking your blood sugar (glucose) after you have not eaten for a while (fasting).  Pelvic exam and Pap test.  Hepatitis B test.  Hepatitis C  test.  HIV (human immunodeficiency virus) test.  STI (sexually transmitted infection) testing, if you are at risk.  Lung cancer screening.  Colorectal cancer screening.  Mammogram. Talk with your health care provider about when you should start having regular mammograms. This may depend on whether you have a family history of breast cancer.  BRCA-related cancer screening. This may be done if you have a family history of breast, ovarian, tubal, or peritoneal cancers.  Bone density scan. This is done to screen for osteoporosis.  Talk with your health care provider about your test results, treatment options, and if necessary, the need for more tests.  Follow these instructions at home:  Eating and drinking    Eat a diet that includes fresh fruits and vegetables, whole grains, lean protein, and low-fat dairy products.  Take vitamin and mineral supplements as recommended by your health care provider.  Do not drink alcohol if:  Your health care provider tells you not to drink.  You are pregnant, may be pregnant, or are planning to become pregnant.  If you drink alcohol:  Limit how much you have to 0-1 drink a day.  Know how much alcohol is in your drink. In the [place] one drink equals one 12 oz bottle of beer (355 mL), one 5 oz glass of wine (148 mL), or one 1 oz glass of hard liquor (44 mL).  Lifestyle  Brush your teeth every morning and night with fluoride toothpaste. Floss one time each day.  Exercise for at least  30 minutes 5 or more days each week.  Do not use any products that contain nicotine or tobacco. These products include cigarettes, chewing tobacco, and vaping devices, such as e-cigarettes. If you need help quitting, ask your health care provider.  Do not use drugs.  If you are sexually active, practice safe sex. Use a condom or other form of protection to prevent STIs.  If you do not wish to become pregnant, use a form of birth control. If you plan to become pregnant, see your health care provider for a  prepregnancy visit.  Take aspirin only as told by your health care provider. Make sure that you understand how much to take and what form to take. Work with your health care provider to find out whether it is safe and beneficial for you to take aspirin daily.  Find healthy ways to manage stress, such as:  Meditation, yoga, or listening to music.  Journaling.  Talking to a trusted person.  Spending time with friends and family.  Minimize exposure to UV radiation to reduce your risk of skin cancer.  Safety  Always wear your seat belt while driving or riding in a vehicle.  Do not drive:  If you have been drinking alcohol. Do not ride with someone who has been drinking.  When you are tired or distracted.  While texting.  If you have been using any mind-altering substances or drugs.  Wear a helmet and other protective equipment during sports activities.  If you have firearms in your house, make sure you follow all gun safety procedures.  Seek help if you have been physically or sexually abused.  What's next?  Visit your health care provider once a year for an annual wellness visit.  Ask your health care provider how often you should have your eyes and teeth checked.  Stay up to date on all vaccines.  This information is not intended to replace advice given to you by your health care provider. Make sure you discuss any questions you have with your health care provider.  Document Revised: 02/19/2021 Document Reviewed: 02/19/2021  Elsevier Patient Education  2024 [employer].

## 2023-09-17 NOTE — Assessment & Plan Note (Signed)
 Lung exam is benign.  CXR ordered.

## 2023-09-17 NOTE — Progress Notes (Signed)
 Katherine Ramsey - 58 y.o. female MRN [id number]  Date of birth: 07-13-1966  Subjective Chief Complaint  Patient presents with   Annual Exam    HPI Katherine Ramsey is a 58 y.o. female here today for annual exam.    Reports that she is having some intermittent vertigo/dizziness.  This is worse with extension of her neck and after seeing the chiropractor sometimes.  She is concerned about circulation in her neck contributing to this   She has had some dyspnea recently  She is fairly active and feels that her diet is good.   She is a non-smoker.  She denies EtOH use.   Review of Systems  Constitutional:  Negative for chills, fever, malaise/fatigue and weight loss.  HENT:  Negative for congestion, ear pain and sore throat.   Eyes:  Negative for blurred vision, double vision and pain.  Respiratory:  Negative for cough and shortness of breath.   Cardiovascular:  Negative for chest pain and palpitations.  Gastrointestinal:  Negative for abdominal pain, blood in stool, constipation, heartburn and nausea.  Genitourinary:  Negative for dysuria and urgency.  Musculoskeletal:  Negative for joint pain and myalgias.  Neurological:  Negative for dizziness and headaches.  Endo/Heme/Allergies:  Does not bruise/bleed easily.  Psychiatric/Behavioral:  Negative for depression. The patient is not nervous/anxious and does not have insomnia.     Allergies  Allergen Reactions   Dairycare [Bacid] Shortness Of Breath and Other (See Comments)    GI issues   Egg-Derived Products Shortness Of Breath and Other (See Comments)    Abd Pain, GI issues   Gluten Meal Shortness Of Breath and Other (See Comments)    GI issues   Other    Shellfish Allergy Rash    Past Medical History:  Diagnosis Date   Hypercholesterolemia    IBS (irritable bowel syndrome)    Kidney disease     Past Surgical History:  Procedure Laterality Date   BACK SURGERY     CESAREAN SECTION     LITHOTRIPSY Right    SINUS EXPLORATION       Social History   Socioeconomic History   Marital status: Married    Spouse name: Not on file   Number of children: Not on file   Years of education: Not on file   Highest education level: 12th grade  Occupational History   Not on file  Tobacco Use   Smoking status: Never    Passive exposure: Never   Smokeless tobacco: Never  Vaping Use   Vaping status: Never Used  Substance and Sexual Activity   Alcohol use: Never   Drug use: Never   Sexual activity: Not Currently    Birth control/protection: None  Other Topics Concern   Not on file  Social History Narrative   Not on file   Social Drivers of Health   Financial Resource Strain: Patient Declined (09/16/2023)   Overall Financial Resource Strain (CARDIA)    Difficulty of Paying Living Expenses: Patient declined  Food Insecurity: Patient Declined (09/16/2023)   Hunger Vital Sign    Worried About Running Out of Food in the Last Year: Patient declined    Ran Out of Food in the Last Year: Patient declined  Transportation Needs: No Transportation Needs (09/16/2023)   PRAPARE - Administrator, Civil Service (Medical): No    Lack of Transportation (Non-Medical): No  Physical Activity: Sufficiently Active (09/16/2023)   Exercise Vital Sign    Days of Exercise per Week:  4 days    Minutes of Exercise per Session: 40 min  Stress: Stress Concern Present (09/16/2023)   [employer] of Occupational Health - Occupational Stress Questionnaire    Feeling of Stress : To some extent  Social Connections: Unknown (09/16/2023)   Social Connection and Isolation Panel [NHANES]    Frequency of Communication with Friends and Family: Patient declined    Frequency of Social Gatherings with Friends and Family: Patient declined    Attends Religious Services: Patient declined    Database Administrator or Organizations: Patient declined    Attends Engineer, Structural: Not on file    Marital Status: Married    Family History   Problem Relation Age of Onset   Colon cancer Father     Health Maintenance  Topic Date Due   DTaP/Tdap/Td (1 - Tdap) Never done   INFLUENZA VACCINE  Never done   COVID-19 Vaccine (5 - 2024-25 season) 05/09/2023   Hepatitis C Screening  09/17/2023 (Originally 12/19/1981)   HIV Screening  09/17/2023 (Originally 12/20/1978)   Colonoscopy  11/20/2023 (Originally 11/06/2022)   Zoster Vaccines- Shingrix (1 of 2) 12/16/2023 (Originally 12/19/2013)   MAMMOGRAM  11/24/2024   Cervical Cancer Screening (HPV/Pap Cotest)  10/02/2026   HPV VACCINES  Aged Out     ----------------------------------------------------------------------------------------------------------------------------------------------------------------------------------------------------------------- Physical Exam BP 95/60 (BP Location: Left Arm, Patient Position: Sitting, Cuff Size: Small)   Pulse (!) 52   Ht 5' 5 (1.651 m)   Wt 120 lb (54.4 kg)   SpO2 100%   BMI 19.97 kg/m   Physical Exam Constitutional:      General: She is not in acute distress. HENT:     Head: Normocephalic and atraumatic.     Right Ear: Tympanic membrane and ear canal normal.     Left Ear: Tympanic membrane and ear canal normal.     Nose: Nose normal.  Eyes:     General: No scleral icterus.    Conjunctiva/sclera: Conjunctivae normal.  Neck:     Thyroid: No thyromegaly.  Cardiovascular:     Rate and Rhythm: Normal rate and regular rhythm.     Heart sounds: Normal heart sounds.  Pulmonary:     Effort: Pulmonary effort is normal.     Breath sounds: Normal breath sounds.  Abdominal:     General: Bowel sounds are normal. There is no distension.     Palpations: Abdomen is soft.     Tenderness: There is no abdominal tenderness. There is no guarding.  Musculoskeletal:        General: Normal range of motion.     Cervical back: Normal range of motion and neck supple.  Lymphadenopathy:     Cervical: No cervical adenopathy.  Skin:    General:  Skin is warm and dry.     Findings: No rash.  Neurological:     General: No focal deficit present.     Mental Status: She is alert and oriented to person, place, and time.     Cranial Nerves: No cranial nerve deficit.     Coordination: Coordination normal.  Psychiatric:        Mood and Affect: Mood normal.        Behavior: Behavior normal.     ------------------------------------------------------------------------------------------------------------------------------------------------------------------------------------------------------------------- Assessment and Plan  Dyspnea Lung exam is benign.  CXR ordered.   Vertigo of central origin Symptoms concerning as they occur with manipulation of neck and extension of neck.  CT angio of the head and neck ordered.  Well adult exam Well adult Orders Placed This Encounter  Procedures   CT ANGIO HEAD NECK W WO CM    Standing Status:   Future    Expiration Date:   09/16/2024    If indicated for the ordered procedure, I authorize the administration of contrast media per Radiology protocol:   Yes    Does the patient have a contrast media/X-ray dye allergy?:   No    Is patient pregnant?:   No    Preferred imaging location?:   [facility]   DG Chest 2 View    Standing Status:   Future    Expiration Date:   09/16/2024    Reason for Exam (SYMPTOM  OR DIAGNOSIS REQUIRED):   Cough and dyspnea    Is patient pregnant?:   No    Preferred imaging location?:   [facility]   CMP14+EGFR   CBC with Differential/Platelet   Lipid Panel With LDL/HDL Ratio   Vitamin D  (25 hydroxy)   Parathyroid  hormone, intact (no Ca)   TSH   B12 and Folate Panel   Ambulatory referral to Gastroenterology    Referral Priority:   Routine    Referral Type:   Consultation    Referral Reason:   Specialty Services Required    Number of Visits Requested:   1  Screenings: per lab orders Immunizations:  Declines.  Anticipatory guidance/Risk  factor reduction:  Recommendations per AVS.    No orders of the defined types were placed in this encounter.   No follow-ups on file.    This visit occurred during the SARS-CoV-2 public health emergency.  Safety protocols were in place, including screening questions prior to the visit, additional usage of staff PPE, and extensive cleaning of exam room while observing appropriate contact time as indicated for disinfecting solutions.

## 2023-09-19 NOTE — Progress Notes (Signed)
ANNUAL EXAM Patient name: Katherine Ramsey MRN [id number]  Date of birth: 1965-09-28 Chief Complaint:   Annual Exam  History of Present Illness:   Katherine Ramsey is a 58 y.o. G1P1 female being seen today for a routine annual exam.   Current concerns: She has ongoing vaginal dryness. She tried vaginal estrogen but it irritated her skin. She has been trying [place] OTC and [place] jelly and vulva balm with some relief. She tried coconut oil with some relief. She also notes a yeast infection and wanted to be checked for this as well today.    No LMP recorded. Patient is postmenopausal.   Last MXR: 11/2022 Last Pap/Pap History:  10/02/21: pap/hpv wnl   Health Maintenance Due  Topic Date Due   HIV Screening  Never done   Hepatitis C Screening  Never done   DTaP/Tdap/Td (1 - Tdap) Never done   COVID-19 Vaccine (5 - 2024-25 season) 05/09/2023    Review of Systems:   Pertinent items are noted in HPI Denies any headaches, blurred vision, fatigue, shortness of breath, chest pain, abdominal pain, abnormal vaginal discharge/itching/odor, problems with periods, bowel movements, urination, or intercourse unless otherwise stated above.  Pertinent History Reviewed:  Reviewed past medical,surgical, social and family history.  Reviewed problem list, medications and allergies. Physical Assessment:   Vitals:   09/23/23 1549  BP: 103/67  Pulse: 62  Weight: 117 lb (53.1 kg)  Height: 5\' 5"  (1.651 m)  Body mass index is 19.47 kg/m.   Physical Examination:  General appearance - well appearing, and in no distress Mental status - alert, oriented to person, place, and time Psych:  She has a normal mood and affect Skin - warm and dry, normal color, no suspicious lesions noted Chest - effort normal Heart - normal rate  Breasts - breasts appear normal, no suspicious masses, no skin or nipple changes or axillary nodes Abdomen - soft, nontender, nondistended, no masses or organomegaly Pelvic -  VULVA:  normal appearing vulva with no masses, tenderness or lesions  VAGINA: normal appearing vagina with normal color and discharge, no lesions  CERVIX: normal appearing cervix without discharge or lesions, no CMT UTERUS: uterus is felt to be normal size, shape, consistency and nontender  ADNEXA: No adnexal masses or tenderness noted. Extremities:  No swelling or varicosities noted  Chaperone present for exam  No results found for this or any previous visit (from the past 24 hours).  Assessment & Plan:  Katherine Ramsey was seen today for annual exam.  Diagnoses and all orders for this visit:  Encounter for annual routine gynecological examination - Cervical cancer screening: Discussed guidelines. Pap with HPV wnl 09/2021.  - Breast Health: Encouraged self breast awareness/SBE. Discussed limits of clinical breast exam for detecting breast cancer. Discussed importance of annual MXR. Rx given for MXR - Climacteric/Sexual health: Reviewed typical and atypical symptoms of menopause/peri-menopause. Discussed PMB and to call if any amount of spotting.  - Colonoscopy: up to date - F/U 12 months and prn -     MM 3D SCREENING MAMMOGRAM BILATERAL BREAST; Future  Vaginal dryness - Vaginal dryness most likely due to being postmenopausal - Recommended OTC lubricants i.e. silicone or oil based as well as vaginal moisturizer i.e. Replens - She has tried vaginal estrogen but could not tolerate. Another option if the above fail would be to consider compounded vaginal estrogen with less additives/preservatives in it due to her multiple allergies.    -     Cervicovaginal ancillary only( [facility])   Orders Placed This Encounter  Procedures   MM 3D SCREENING MAMMOGRAM BILATERAL BREAST    Meds: No orders of the defined types were placed in this encounter.   Follow-up: Return in about 2 years (around 09/22/2025) for annual.  Milas Hock, MD 09/23/2023 4:17 PM

## 2023-09-21 LAB — CBC WITH DIFFERENTIAL/PLATELET
Basophils Absolute: 0.1 10*3/uL (ref 0.0–0.2)
Basos: 2 %
EOS (ABSOLUTE): 0.2 10*3/uL (ref 0.0–0.4)
Eos: 5 %
Hematocrit: 41.5 % (ref 34.0–46.6)
Hemoglobin: 13.8 g/dL (ref 11.1–15.9)
Immature Grans (Abs): 0 10*3/uL (ref 0.0–0.1)
Immature Granulocytes: 0 %
Lymphocytes Absolute: 1.2 10*3/uL (ref 0.7–3.1)
Lymphs: 29 %
MCH: 31.7 pg (ref 26.6–33.0)
MCHC: 33.3 g/dL (ref 31.5–35.7)
MCV: 95 fL (ref 79–97)
Monocytes Absolute: 0.3 10*3/uL (ref 0.1–0.9)
Monocytes: 8 %
Neutrophils Absolute: 2.3 10*3/uL (ref 1.4–7.0)
Neutrophils: 56 %
Platelets: 206 10*3/uL (ref 150–450)
RBC: 4.35 x10E6/uL (ref 3.77–5.28)
RDW: 11.9 % (ref 11.7–15.4)
WBC: 4.2 10*3/uL (ref 3.4–10.8)

## 2023-09-21 LAB — LIPID PANEL WITH LDL/HDL RATIO
Cholesterol, Total: 246 mg/dL — ABNORMAL HIGH (ref 100–199)
HDL: 116 mg/dL (ref >39)
LDL Chol Calc (NIH): 121 mg/dL — ABNORMAL HIGH (ref 0–99)
LDL/HDL Ratio: 1 {ratio} (ref 0.0–3.2)
Triglycerides: 56 mg/dL (ref 0–149)
VLDL Cholesterol Cal: 9 mg/dL (ref 5–40)

## 2023-09-21 LAB — VITAMIN D 25 HYDROXY (VIT D DEFICIENCY, FRACTURES): Vit D, 25-Hydroxy: 41.9 ng/mL (ref 30.0–100.0)

## 2023-09-21 LAB — B12 AND FOLATE PANEL
Folate: 6.4 ng/mL (ref >3.0)
Vitamin B-12: 566 pg/mL (ref 232–1245)

## 2023-09-21 LAB — CMP14+EGFR
ALT: 15 [IU]/L (ref 0–32)
AST: 20 [IU]/L (ref 0–40)
Albumin: 4.5 g/dL (ref 3.8–4.9)
Alkaline Phosphatase: 60 [IU]/L (ref 44–121)
BUN/Creatinine Ratio: 24 — ABNORMAL HIGH (ref 9–23)
BUN: 16 mg/dL (ref 6–24)
Bilirubin Total: 0.6 mg/dL (ref 0.0–1.2)
CO2: 25 mmol/L (ref 20–29)
Calcium: 9.4 mg/dL (ref 8.7–10.2)
Chloride: 102 mmol/L (ref 96–106)
Creatinine, Ser: 0.68 mg/dL (ref 0.57–1.00)
Globulin, Total: 2.5 g/dL (ref 1.5–4.5)
Glucose: 78 mg/dL (ref 70–99)
Potassium: 4.8 mmol/L (ref 3.5–5.2)
Sodium: 141 mmol/L (ref 134–144)
Total Protein: 7 g/dL (ref 6.0–8.5)
eGFR: 100 mL/min/{1.73_m2} (ref >59)

## 2023-09-21 LAB — PARATHYROID HORMONE, INTACT (NO CA): PTH: 49 pg/mL (ref 15–65)

## 2023-09-21 LAB — TSH: TSH: 2.06 u[IU]/mL (ref 0.450–4.500)

## 2023-09-23 ENCOUNTER — Encounter: Payer: Self-pay | Admitting: Obstetrics and Gynecology

## 2023-09-23 ENCOUNTER — Other Ambulatory Visit (HOSPITAL_COMMUNITY)
Admission: RE | Admit: 2023-09-23 | Discharge: 2023-09-23 | Disposition: A | Discharge status: Home / Self Care | Payer: PRIVATE HEALTH INSURANCE | Source: Ambulatory Visit | Attending: Obstetrics and Gynecology | Admitting: Obstetrics and Gynecology

## 2023-09-23 ENCOUNTER — Ambulatory Visit: Payer: PRIVATE HEALTH INSURANCE | Admitting: Obstetrics and Gynecology

## 2023-09-23 VITALS — BP 103/67 | HR 62 | Ht 65.0 in | Wt 117.0 lb

## 2023-09-23 DIAGNOSIS — Z01419 Encounter for gynecological examination (general) (routine) without abnormal findings: Secondary | ICD-10-CM

## 2023-09-23 DIAGNOSIS — N898 Other specified noninflammatory disorders of vagina: Secondary | ICD-10-CM | POA: Diagnosis present

## 2023-09-23 NOTE — Patient Instructions (Signed)
Replens personal moisturizer

## 2023-09-24 ENCOUNTER — Encounter: Payer: Self-pay | Admitting: Family Medicine

## 2023-09-24 LAB — CERVICOVAGINAL ANCILLARY ONLY
Bacterial Vaginitis (gardnerella): NEGATIVE
Candida Glabrata: NEGATIVE
Candida Vaginitis: NEGATIVE
Comment: NEGATIVE
Comment: NEGATIVE
Comment: NEGATIVE

## 2023-09-24 NOTE — Progress Notes (Signed)
Please schedule patient for spirometry.  Thanks!

## 2023-09-28 ENCOUNTER — Encounter: Payer: Self-pay | Admitting: Obstetrics and Gynecology

## 2023-10-01 ENCOUNTER — Other Ambulatory Visit (HOSPITAL_BASED_OUTPATIENT_CLINIC_OR_DEPARTMENT_OTHER): Payer: PRIVATE HEALTH INSURANCE | Admitting: Radiology

## 2023-10-01 ENCOUNTER — Ambulatory Visit (INDEPENDENT_AMBULATORY_CARE_PROVIDER_SITE_OTHER): Payer: PRIVATE HEALTH INSURANCE

## 2023-10-01 DIAGNOSIS — H814 Vertigo of central origin: Secondary | ICD-10-CM

## 2023-10-01 MED ORDER — IOHEXOL 350 MG/ML SOLN
100.0000 mL | Freq: Once | INTRAVENOUS | Status: AC | PRN
  Administered 2023-10-01: 75 mL via INTRAVENOUS

## 2023-10-05 ENCOUNTER — Ambulatory Visit: Payer: PRIVATE HEALTH INSURANCE | Admitting: Family Medicine

## 2023-10-05 ENCOUNTER — Ambulatory Visit (INDEPENDENT_AMBULATORY_CARE_PROVIDER_SITE_OTHER): Payer: PRIVATE HEALTH INSURANCE | Admitting: Family Medicine

## 2023-10-05 ENCOUNTER — Encounter (INDEPENDENT_AMBULATORY_CARE_PROVIDER_SITE_OTHER): Payer: Self-pay

## 2023-10-05 VITALS — BP 97/63 | HR 64 | Ht 65.0 in | Wt 118.0 lb

## 2023-10-05 DIAGNOSIS — R0600 Dyspnea, unspecified: Secondary | ICD-10-CM | POA: Diagnosis not present

## 2023-10-05 DIAGNOSIS — J449 Chronic obstructive pulmonary disease, unspecified: Secondary | ICD-10-CM | POA: Diagnosis not present

## 2023-10-05 MED ORDER — ALBUTEROL SULFATE (2.5 MG/3ML) 0.083% IN NEBU
2.5000 mg | INHALATION_SOLUTION | Freq: Once | RESPIRATORY_TRACT | Status: AC
Start: 2023-10-05 — End: 2023-10-05
  Administered 2023-10-05: 2.5 mg via RESPIRATORY_TRACT

## 2023-10-05 NOTE — Progress Notes (Signed)
Katherine Ramsey - 58 y.o. female MRN [id number]  Date of birth: 03/26/1966  Subjective Chief Complaint  Patient presents with   Shortness of Breath    HPI Katherine Ramsey is a 58 year old female here today for follow-up of recent chest x-ray.  She has had some cough and dyspnea over the past couple months.  Chest x-ray with pulmonary hyperinflation suspicious for COPD.  She is here for spirometry today.  She has no prior history of smoking.  There is significant secondhand smoke.  ROS:  A comprehensive ROS was completed and negative except as noted per HPI  Allergies  Allergen Reactions   Dairycare [Bacid] Shortness Of Breath and Other (See Comments)    GI issues   Egg-Derived Products Shortness Of Breath and Other (See Comments)    Abd Pain, GI issues   Gluten Meal Shortness Of Breath and Other (See Comments)    GI issues   Other    Shellfish Allergy Rash    Past Medical History:  Diagnosis Date   Hypercholesterolemia    IBS (irritable bowel syndrome)    Kidney disease     Past Surgical History:  Procedure Laterality Date   BACK SURGERY     CESAREAN SECTION     LITHOTRIPSY Right    SINUS EXPLORATION      Social History   Socioeconomic History   Marital status: Married    Spouse name: Not on file   Number of children: Not on file   Years of education: Not on file   Highest education level: 12th grade  Occupational History   Not on file  Tobacco Use   Smoking status: Never    Passive exposure: Never   Smokeless tobacco: Never  Vaping Use   Vaping status: Never Used  Substance and Sexual Activity   Alcohol use: Never   Drug use: Never   Sexual activity: Not Currently    Birth control/protection: None  Other Topics Concern   Not on file  Social History Narrative   Not on file   Social Drivers of Health   Financial Resource Strain: Patient Declined (09/16/2023)   Overall Financial Resource Strain (CARDIA)    Difficulty of Paying Living Expenses: Patient  declined  Food Insecurity: Patient Declined (09/16/2023)   Hunger Vital Sign    Worried About Running Out of Food in the Last Year: Patient declined    Ran Out of Food in the Last Year: Patient declined  Transportation Needs: No Transportation Needs (09/16/2023)   PRAPARE - Administrator, Civil Service (Medical): No    Lack of Transportation (Non-Medical): No  Physical Activity: Sufficiently Active (09/16/2023)   Exercise Vital Sign    Days of Exercise per Week: 4 days    Minutes of Exercise per Session: 40 min  Stress: Stress Concern Present (09/16/2023)   [employer] of Occupational Health - Occupational Stress Questionnaire    Feeling of Stress : To some extent  Social Connections: Unknown (09/16/2023)   Social Connection and Isolation Panel [NHANES]    Frequency of Communication with Friends and Family: Patient declined    Frequency of Social Gatherings with Friends and Family: Patient declined    Attends Religious Services: Patient declined    Database administrator or Organizations: Patient declined    Attends Engineer, structural: Not on file    Marital Status: Married    Family History  Problem Relation Age of Onset   Colon cancer Father  Health Maintenance  Topic Date Due   HIV Screening  Never done   Hepatitis C Screening  Never done   DTaP/Tdap/Td (1 - Tdap) Never done   COVID-19 Vaccine (5 - 2024-25 season) 05/09/2023   Colonoscopy  11/20/2023 (Originally 11/06/2022)   INFLUENZA VACCINE  12/06/2023 (Originally 04/08/2023)   Zoster Vaccines- Shingrix (1 of 2) 12/16/2023 (Originally 12/19/2013)   MAMMOGRAM  11/24/2024   Cervical Cancer Screening (HPV/Pap Cotest)  10/02/2026   HPV VACCINES  Aged Out     ----------------------------------------------------------------------------------------------------------------------------------------------------------------------------------------------------------------- Physical Exam BP 97/63   Pulse 64    Ht 5\' 5"  (1.651 m)   Wt 118 lb (53.5 kg)   SpO2 97%   BMI 19.64 kg/m   Physical Exam Constitutional:      Appearance: She is well-developed.  HENT:     Head: Normocephalic and atraumatic.  Cardiovascular:     Rate and Rhythm: Normal rate and regular rhythm.  Pulmonary:     Effort: Pulmonary effort is normal.     Breath sounds: Normal breath sounds.  Neurological:     Mental Status: She is alert.  Psychiatric:        Mood and Affect: Mood normal.        Behavior: Behavior normal.     ------------------------------------------------------------------------------------------------------------------------------------------------------------------------------------------------------------------- Assessment and Plan  Chronic obstructive pulmonary disease (HCC) Spirometry does show mild to moderate obstructive pattern.  This in combination with her chest x-ray findings are concerning for COPD.  She does not have a history of smoking.  Checking alpha-1 antitrypsin deficiency labs.  Referral placed to pulmonology.   Meds ordered this encounter  Medications   albuterol (PROVENTIL) (2.5 MG/3ML) 0.083% nebulizer solution 2.5 mg    No follow-ups on file.    This visit occurred during the SARS-CoV-2 public health emergency.  Safety protocols were in place, including screening questions prior to the visit, additional usage of staff PPE, and extensive cleaning of exam room while observing appropriate contact time as indicated for disinfecting solutions.

## 2023-10-05 NOTE — Assessment & Plan Note (Signed)
Spirometry does show mild to moderate obstructive pattern.  This in combination with her chest x-ray findings are concerning for COPD.  She does not have a history of smoking.  Checking alpha-1 antitrypsin deficiency labs.  Referral placed to pulmonology.

## 2023-10-08 ENCOUNTER — Encounter: Payer: Self-pay | Admitting: Family Medicine

## 2023-10-11 LAB — ALPHA-1-ANTITRYPSIN DEFICIENCY

## 2023-10-15 ENCOUNTER — Encounter: Payer: Self-pay | Admitting: Family Medicine

## 2023-10-26 ENCOUNTER — Telehealth: Payer: Self-pay | Admitting: Gastroenterology

## 2023-10-26 ENCOUNTER — Telehealth: Payer: Self-pay

## 2023-10-26 DIAGNOSIS — Z1211 Encounter for screening for malignant neoplasm of colon: Secondary | ICD-10-CM

## 2023-10-26 NOTE — Telephone Encounter (Signed)
 Created referral and noted reason for change from [facility] to [facility].   Tosha: Please follow-up per patient's spouse request. Thanks

## 2023-10-26 NOTE — Telephone Encounter (Signed)
 Patient husband called and stated that he would like his wife to see Dr. Adela Lank. Patient husband is aware that we need to so a transfer of care before we can schedule his wife. Patient Husband did verbalize understanding.

## 2023-10-26 NOTE — Telephone Encounter (Signed)
 Patients spouse would like patient to get referral for colonscopy prevental screening, Patient had her last on 11/05/2017.

## 2023-10-26 NOTE — Telephone Encounter (Signed)
 Referral and clinical notes have been sent to [facility] through Epic. Office will contact patient to schedule referral appointment.

## 2023-10-27 ENCOUNTER — Telehealth: Payer: Self-pay | Admitting: Gastroenterology

## 2023-10-27 NOTE — Telephone Encounter (Signed)
 Pt aware that transfer of care has been started and we are awaiting approval.

## 2023-10-27 NOTE — Telephone Encounter (Signed)
 Contacted [facility] ( spoke with Sophia)to start Transfer of care.  Once transfer of transfer of care has been approved, patient will be contacted to schedule an appointment.

## 2023-10-28 NOTE — Telephone Encounter (Signed)
 Good morning Dr. Adela Lank,   We received a call from this patient's husband wishing to schedule an appointment with you for a colonoscopy. Patient last had colonoscopy in 11/2017 with [facility], since then patient's insurance has changed and she would like to be seen at [facility], specifically with you. Records are in Epic for you to review. Would you please advise on scheduling?  Thank you.

## 2023-10-28 NOTE — Telephone Encounter (Signed)
 Looks like she has a strong family history of colon cancer (father), last exam in 2019 was normal. Okay to book for direct colonoscopy with me in the [facility] if she is otherwise a candidate for [facility]. If she has symptoms and wants to be seen in clinic first, that is also okay, whichever her preference. Thanks

## 2023-11-01 NOTE — Telephone Encounter (Signed)
 Spoke with patient, she wanted a morning appointment, would be out of town for April and May and stated she would call back in March to schedule for June.

## 2023-11-25 ENCOUNTER — Encounter: Payer: Self-pay | Admitting: Internal Medicine

## 2023-11-25 ENCOUNTER — Ambulatory Visit: Payer: PRIVATE HEALTH INSURANCE | Admitting: Internal Medicine

## 2023-11-25 VITALS — BP 112/72 | HR 72 | Temp 97.8°F | Ht 65.0 in | Wt 115.8 lb

## 2023-11-25 DIAGNOSIS — J4489 Other specified chronic obstructive pulmonary disease: Secondary | ICD-10-CM | POA: Insufficient documentation

## 2023-11-25 NOTE — Patient Instructions (Addendum)
 In the event your cough returns:  Try prilosec otc 20mg   Take 30-60 min before first meal of the day and Pepcid ac (famotidine) 20 mg one after  supper  until cough is completely gone for at least a week without the need for cough suppression  For cough / congestion >>  mucinex dm  up 1200 mg evey 12 hours as needed    For drainage / throat tickle try take CHLORPHENIRAMINE  4 mg  (Allergy Relief 4mg   at [facility] should be easiest to find in the blue box usually on bottom shelf)  take one every 4 hours as needed - extremely effective and inexpensive over the counter- may cause drowsiness so start with just a dose or two an hour before bedtime and see how you tolerate it before trying in daytime.    GERD (REFLUX)  is an extremely common cause of respiratory symptoms just like yours , many times with no obvious heartburn at all.    It can be treated with medication, but also with lifestyle changes including elevation of the head of your bed (ideally with 6 -8inch blocks under the headboard of your bed),  Smoking cessation, avoidance of late meals, excessive alcohol, and avoid fatty foods, chocolate, peppermint, colas, red wine, and acidic juices such as orange juice.  NO MINT OR MENTHOL PRODUCTS SO NO COUGH DROPS - LUDENs  USE SUGARLESS CANDY INSTEAD (Jolley ranchers or Stover's or Environmental manager) or even ice chips will also do - the key is to swallow to prevent all throat clearing. NO OIL BASED VITAMINS - use powdered substitutes.  Avoid fish oil when coughing.    We will set you PFTs with methacholine challenge and call you results.

## 2023-11-25 NOTE — Progress Notes (Signed)
 Katherine Ramsey, female    DOB: 1966-05-21   MRN: [id number]   Brief patient profile:  58 yo never smoker from  [place] with  a lot of exp to indoor and outdoor pollution growing up but emigrated 1998 to [place] and    referred to pulmonary clinic 11/25/2023 by Everrett Coombe  for sneezy/ runny nose/ sob  x" years" with suspicion for COPD on cxr and suggested  by office spirometry (tracings not in EPIC)     History of Present Illness  11/25/2023  Pulmonary/ 1st office eval/Monty Spicher  Chief Complaint  Patient presents with   Consult  Dyspnea:  does fine x with multiple flights of steps  Cough: immediately when lying down and can last all  all night  but much better now  s inhalers  Rx with honey, says either can't tol usual meds or "they don't work" "never have"  Sleep: 30 degrees  SABA use: none  02 NWG:NFAO    No obvious day to day or daytime pattern/variability or assoc excess/ purulent sputum or mucus plugs or hemoptysis or cp or chest tightness, subjective wheeze or overt   hb symptoms.    Also denies any obvious fluctuation of symptoms with weather or environmental changes or other aggravating or alleviating factors except as outlined above   No unusual exposure hx or h/o childhood pna/ asthma or knowledge of premature birth.  Current Allergies, Complete Past Medical History, Past Surgical History, Family History, and Social History were reviewed in [employer] record.  ROS  The following are not active complaints unless bolded Hoarseness, sore throat, dysphagia, dental problems, itching, sneezing,  nasal congestion or discharge of excess mucus or purulent secretions, ear ache,   fever, chills, sweats, unintended wt loss or wt gain, classically pleuritic or exertional cp,  orthopnea pnd or arm/hand swelling  or leg swelling, presyncope, palpitations, abdominal pain, anorexia, nausea, vomiting, diarrhea  or change in bowel habits or change in bladder habits, change in  stools or change in urine, dysuria, hematuria,  rash, arthralgias, visual complaints, headache, numbness, weakness or ataxia or problems with walking or coordination,  change in mood or  memory.             No outpatient medications prior to visit.   Facility-Administered Medications Prior to Visit  Medication Dose Route Frequency Provider Last Rate Last Admin   dexamethasone (DECADRON) injection 4 mg  4 mg Intra-articular Once Louann Sjogren, DPM        Past Medical History:  Diagnosis Date   Hypercholesterolemia    IBS (irritable bowel syndrome)    Kidney disease       Objective:     BP 112/72 (BP Location: Left Arm, Patient Position: Sitting, Cuff Size: Small)   Pulse 72   Temp 97.8 F (36.6 C) (Temporal)   Ht 5\' 5"  (1.651 m)   Wt 115 lb 12.8 oz (52.5 kg)   SpO2 97%   BMI 19.27 kg/m   SpO2: 97 % RA  Amb wf nad    HEENT : Oropharynx  clear   Nasal turbinates nl    NECK :  without  apparent JVD/ palpable Nodes/TM    LUNGS: no acc muscle use,  Min barrel  contour chest wall with bilateral  slightly decreased bs s audible wheeze and  without cough on insp or exp maneuvers and min  Hyperresonant  to  percussion bilaterally    CV:  RRR  no s3 or murmur or  increase in P2, and no edema   ABD:  soft and nontender with pos end  insp Hoover's  in the supine position.  No bruits or organomegaly appreciated   MS:  Nl gait/ ext warm without deformities Or obvious joint restrictions  calf tenderness, cyanosis or clubbing     SKIN: warm and dry without lesions    NEURO:  alert, approp, nl sensorium with  no motor or cerebellar deficits apparent.         I personally reviewed images and agree with radiology impression as follows:  CXR:   pa and lateral 09/17/23  Pulmonary hyperinflation, suspicious for COPD. No active disease.      Assessment   Asthmatic bronchitis , chronic (HCC) Never smoker but heavy passive exp to indoor/ outdoor polllution in [place] -  Eos 0.2  09/17/23  - alpha one AT reported neg 10/05/23 (no phenoytpe avail)  - 11/25/2023   Walked on RA   x  3  lap(s) =  approx 750  ft  @ moderate pace, stopped due to end of study s sob  with lowest 02 sats 94% improved to 96% by the end of walk   She does not really have much for chronic symptoms but may be prone to flares.  However, she says"nothing ever works" for flares but is willing to try a conservative otc rx for uri/ rhinitis or allergy/ rhiniits induced flares of cough and return for full pfts to complete the w/u - sooner if has another flare in meantime   Discussed in detail all the  indications, usual  risks and alternatives  relative to the benefits with patient who agrees to proceed with Rx and w/u as outlined.  Each maintenance medication was reviewed in detail including emphasizing most importantly the difference between maintenance and prns and under what circumstances the prns are to be triggered using an action plan format where appropriate.  Total time for H and P, chart review, counseling,  directly observing portions of ambulatory 02 saturation study/ and generating customized AVS unique to this office visit / same day charting = 46 min new pt eval                         Sandrea Hughs, MD 11/25/2023

## 2023-11-25 NOTE — Assessment & Plan Note (Addendum)
 Never smoker but heavy passive exp to indoor/ outdoor polllution in [place] - Eos 0.2  09/17/23  - alpha one AT reported neg 10/05/23 (no phenoytpe avail)  - 11/25/2023   Walked on RA   x  3  lap(s) =  approx 750  ft  @ moderate pace, stopped due to end of study s sob  with lowest 02 sats 94% improved to 96% by the end of walk   She does not really have much for chronic symptoms but may be prone to flares.  However, she says"nothing ever works" for flares but is willing to try a conservative otc rx for uri/ rhinitis or allergy/ rhiniits induced flares of cough and return for full pfts to complete the w/u - sooner if has another flare in meantime   Discussed in detail all the  indications, usual  risks and alternatives  relative to the benefits with patient who agrees to proceed with Rx and w/u as outlined.  Each maintenance medication was reviewed in detail including emphasizing most importantly the difference between maintenance and prns and under what circumstances the prns are to be triggered using an action plan format where appropriate.  Total time for H and P, chart review, counseling,  directly observing portions of ambulatory 02 saturation study/ and generating customized AVS unique to this office visit / same day charting = 46 min new pt eval

## 2023-12-06 LAB — HM MAMMOGRAPHY

## 2024-02-18 ENCOUNTER — Encounter: Payer: Self-pay | Admitting: Internal Medicine

## 2024-02-18 ENCOUNTER — Telehealth: Payer: Self-pay | Admitting: Internal Medicine

## 2024-02-18 ENCOUNTER — Ambulatory Visit (INDEPENDENT_AMBULATORY_CARE_PROVIDER_SITE_OTHER): Payer: PRIVATE HEALTH INSURANCE | Admitting: Internal Medicine

## 2024-02-18 VITALS — BP 114/66 | HR 58 | Temp 97.5°F | Ht 65.0 in | Wt 112.4 lb

## 2024-02-18 DIAGNOSIS — J4489 Other specified chronic obstructive pulmonary disease: Secondary | ICD-10-CM | POA: Diagnosis not present

## 2024-02-18 NOTE — Progress Notes (Unsigned)
 Katherine Ramsey, female    DOB: 10-03-1965   MRN: [id number]   Brief patient profile:  58 yo never smoker from [place] with  a lot of exp to indoor and outdoor pollution growing up but emigrated 1998 to [place]  and    referred to pulmonary clinic 11/25/2023 by Adela Holter  for sneezy/ runny nose/ sob  x years with suspicion for COPD on cxr and suggested  by office spirometry (tracings not in EPIC)   On allergy shots x  11 years from [facility] allergist did not help so stopped around 2020   History of Present Illness  11/25/2023  Pulmonary/ 1st office eval/[name]  Chief Complaint  Patient presents with   Consult  Dyspnea:  does fine x with multiple flights of steps  Cough: immediately when lying down and can last all  all night  but much better now  s inhalers  Rx with honey, says either can't tol usual meds or they don't work never have  Sleep: 30 degrees  SABA use: none  02 VFI:EPPI  Rec Try prilosec otc 20mg   Take 30-60 min before first meal of the day and Pepcid ac (famotidine) 20 mg one after  supper  until cough is completely gone for at least a week without the need for cough suppression For cough / congestion mucinex dm  up 1200 mg evey 12 hours  For drainage / throat tickle try take CHLORPHENIRAMINE  4 mg   GERD diet reviewed, bed blocks rec     02/18/2024  f/u ov/[name] re: [facility]    maint on altenative meds nothing you  or anyone else gave me worked or I became allergic to it  ?  Do I have copd   Chief Complaint  Patient presents with   Follow-up    COPD f/u   Dyspnea:  none  Cough: dry daytime sporadic and not present for over a  month  Sleeping: 30 degrees  fine now  SABA use: none  02: none      No obvious day to day or daytime variability or assoc excess/ purulent sputum or mucus plugs or hemoptysis or cp or chest tightness, subjective wheeze or overt sinus or hb symptoms.    Also denies any obvious fluctuation of symptoms with weather or environmental changes or  other aggravating or alleviating factors except as outlined above   No unusual exposure hx or h/o childhood pna/ asthma or knowledge of premature birth.  Current Allergies, Complete Past Medical History, Past Surgical History, Family History, and Social History were reviewed in [employer] record.  ROS  The following are not active complaints unless bolded Hoarseness, sore throat, dysphagia, dental problems, itching, sneezing,  nasal congestion or discharge of excess mucus or purulent secretions, ear ache,   fever, chills, sweats, unintended wt loss or wt gain, classically pleuritic or exertional cp,  orthopnea pnd or arm/hand swelling  or leg swelling, presyncope, palpitations, abdominal pain, anorexia, nausea, vomiting, diarrhea  or change in bowel habits or change in bladder habits, change in stools or change in urine, dysuria, hematuria,  rash, arthralgias, visual complaints, headache, numbness, weakness or ataxia or problems with walking or coordination,  change in mood or  memory.         Meds: none         Past Medical History:  Diagnosis Date   Hypercholesterolemia    IBS (irritable bowel syndrome)    Kidney disease       Objective:  Wt Readings from Last 3 Encounters:  02/18/24 112 lb 6.4 oz (51 kg)  11/25/23 115 lb 12.8 oz (52.5 kg)  10/05/23 118 lb (53.5 kg)      Vital signs reviewed  02/18/2024  - Note at rest 02 sats  97% on RA   General appearance:    healthy appearing amb wf nad      HEENT : Oropharynx  clear / no pnds or cobblestoning   Nasal turbinates nl    NECK :  without  apparent JVD/ palpable Nodes/TM    LUNGS: no acc muscle use,  Min barrel  contour chest wall with bilateral  slightly decreased bs s audible wheeze and  without cough on insp or exp maneuvers and min  Hyperresonant  to  percussion bilaterally    CV:  RRR  no s3 or murmur or increase in P2, and no edema   ABD:  soft and nontender with pos end  insp Hoover's   in the supine position.  No bruits or organomegaly appreciated   MS:  Nl gait/ ext warm without deformities Or obvious joint restrictions  calf tenderness, cyanosis or clubbing     SKIN: warm and dry without lesions    NEURO:  alert, approp, nl sensorium with  no motor or cerebellar deficits apparent.             Assessment

## 2024-02-18 NOTE — Telephone Encounter (Signed)
 PT presented to the front upset as they checked out because the PFT was not sched by the front desk as directed in the first visit AVS. Pptdoes not want to be charged for this OV today. States even Dr. Waymond Hailey apologized for the error. A PFT was made today  and PT is OK with that, just feels they should not be responsible for a visit that was not complete as directed. They asked that we call Drexel Gentles, husband, to advise how this will be handled.

## 2024-02-18 NOTE — Patient Instructions (Signed)
 In the event your cough returns:  Try prilosec otc 20mg   Take 30-60 min before first meal of the day and Pepcid ac (famotidine) 20 mg one after  supper  until cough is completely gone for at least a week without the need for cough suppression  For cough / congestion >>  mucinex dm  up 1200 mg evey 12 hours as needed     GERD (REFLUX)  is an extremely common cause of respiratory symptoms just like yours , many times with no obvious heartburn at all.    It can be treated with medication, but also with lifestyle changes including elevation of the head of your bed (ideally with 6 -8inch blocks under the headboard of your bed),  Smoking cessation, avoidance of late meals, excessive alcohol, and avoid fatty foods, chocolate, peppermint, colas, red wine, and acidic juices such as orange juice.  NO MINT OR MENTHOL PRODUCTS SO NO COUGH DROPS - LUDENs  USE SUGARLESS CANDY INSTEAD (Jolley ranchers or Stover's or Environmental manager) or even ice chips will also do - the key is to swallow to prevent all throat clearing. NO OIL BASED VITAMINS - use powdered substitutes.  Avoid fish oil when coughing.    We will set you PFTs with methacholine challenge and call you results - no follow up needed here unless indicated by the test

## 2024-02-19 NOTE — Assessment & Plan Note (Signed)
 Never smoker but heavy passive exp to indoor/ outdoor polllution in [place] - Eos 0.2  09/17/23  - alpha one AT reported neg 10/05/23 (no phenoytpe avail)  - 11/25/2023   Walked on RA   x  3  lap(s) =  approx 750  ft  @ moderate pace, stopped due to end of study s sob  with lowest 02 sats 94% improved to 96% by the end of walk   Main problem is cough, not limiting doe and I strongly suspect UACS over cough variant asthma or AB  Rec In event of severe flare rec try mucine dm/ max otc gerd rx to prevent cyclical cough   see avs for instructions unique to this ov  F/u p Methacholine challenge test  if indicated          Each maintenance medication was reviewed in detail including emphasizing most importantly the difference between maintenance and prns and under what circumstances the prns are to be triggered using an action plan format where appropriate.  Total time for H and P, chart review, counseling,  and generating customized AVS unique to this office visit / same day charting = 33 min final summary f/u ov

## 2024-02-21 NOTE — Telephone Encounter (Signed)
 Spoke with patient's husband, states patient thought she was going into the office on 6/13 to do the methacholine challenge that was instructed on the last AVS, not an office visit. Patient states she didn't want another visit, she wanted the test. Will talk to Dr. Waymond Hailey and call Josip back.

## 2024-03-24 ENCOUNTER — Ambulatory Visit (HOSPITAL_COMMUNITY)
Admission: RE | Admit: 2024-03-24 | Discharge: 2024-03-24 | Disposition: A | Discharge status: Home / Self Care | Payer: PRIVATE HEALTH INSURANCE | Source: Ambulatory Visit | Attending: Internal Medicine | Admitting: Internal Medicine

## 2024-03-24 DIAGNOSIS — J4489 Other specified chronic obstructive pulmonary disease: Secondary | ICD-10-CM | POA: Insufficient documentation

## 2024-03-24 LAB — PULMONARY FUNCTION TEST
FEF 25-75 Post: 1.1 L/s
FEF 25-75 Pre: 1.1 L/s
FEF2575-%Change-Post: 0 %
FEF2575-%Pred-Post: 45 %
FEF2575-%Pred-Pre: 45 %
FEV1-%Change-Post: 0 %
FEV1-%Pred-Post: 73 %
FEV1-%Pred-Pre: 73 %
FEV1-Post: 1.96 L
FEV1-Pre: 1.95 L
FEV1FVC-%Change-Post: 4 %
FEV1FVC-%Pred-Pre: 85 %
FEV6-%Change-Post: -1 %
FEV6-%Pred-Post: 84 %
FEV6-%Pred-Pre: 86 %
FEV6-Post: 2.81 L
FEV6-Pre: 2.85 L
FEV6FVC-%Change-Post: 1 %
FEV6FVC-%Pred-Post: 102 %
FEV6FVC-%Pred-Pre: 101 %
FVC-%Change-Post: -3 %
FVC-%Pred-Post: 82 %
FVC-%Pred-Pre: 85 %
FVC-Post: 2.83 L
FVC-Pre: 2.93 L
Post FEV1/FVC ratio: 69 %
Post FEV6/FVC ratio: 99 %
Pre FEV1/FVC ratio: 66 %
Pre FEV6/FVC Ratio: 98 %

## 2024-03-24 MED ORDER — ALBUTEROL SULFATE (2.5 MG/3ML) 0.083% IN NEBU
2.5000 mg | INHALATION_SOLUTION | Freq: Once | RESPIRATORY_TRACT | Status: AC
  Administered 2024-03-24: 2.5 mg via RESPIRATORY_TRACT

## 2024-03-24 MED ORDER — METHACHOLINE 0 MG/ML NEB SOLN
3.0000 mL | Freq: Once | RESPIRATORY_TRACT | Status: AC
  Administered 2024-03-24: 3 mL via RESPIRATORY_TRACT
  Filled 2024-03-24: qty 3

## 2024-03-24 MED ORDER — METHACHOLINE 16 MG/ML NEB SOLN
3.0000 mL | Freq: Once | RESPIRATORY_TRACT | Status: AC
  Administered 2024-03-24: 48 mg via RESPIRATORY_TRACT
  Filled 2024-03-24: qty 3

## 2024-03-24 MED ORDER — METHACHOLINE 0.25 MG/ML NEB SOLN
3.0000 mL | Freq: Once | RESPIRATORY_TRACT | Status: AC
  Administered 2024-03-24: 0.75 mg via RESPIRATORY_TRACT
  Filled 2024-03-24: qty 3

## 2024-03-24 MED ORDER — METHACHOLINE 0.0625 MG/ML NEB SOLN
3.0000 mL | Freq: Once | RESPIRATORY_TRACT | Status: AC
  Administered 2024-03-24: 0.1875 mg via RESPIRATORY_TRACT
  Filled 2024-03-24: qty 3

## 2024-03-24 MED ORDER — METHACHOLINE 4 MG/ML NEB SOLN
3.0000 mL | Freq: Once | RESPIRATORY_TRACT | Status: AC
  Administered 2024-03-24: 12 mg via RESPIRATORY_TRACT
  Filled 2024-03-24: qty 3

## 2024-03-24 MED ORDER — METHACHOLINE 1 MG/ML NEB SOLN
3.0000 mL | Freq: Once | RESPIRATORY_TRACT | Status: AC
  Administered 2024-03-24: 3 mg via RESPIRATORY_TRACT
  Filled 2024-03-24: qty 3

## 2024-03-26 ENCOUNTER — Ambulatory Visit: Payer: Self-pay | Admitting: Internal Medicine

## 2024-03-27 NOTE — Progress Notes (Signed)
 I called the pt and there was no answer- LMTCB. ?

## 2024-04-03 ENCOUNTER — Telehealth: Payer: Self-pay | Admitting: Internal Medicine

## 2024-04-03 MED ORDER — MONTELUKAST SODIUM 10 MG PO TABS: 10.0000 mg | ORAL_TABLET | Freq: Every day | ORAL | 3 refills | Status: AC

## 2024-04-03 NOTE — Telephone Encounter (Signed)
 Copied from CRM #[id number]. Topic: Clinical - Lab/Test Results >> Apr 03, 2024  8:15 AM Benton KIDD wrote: Reason for CRM: patient is returning a call from leslie that she received on 7/21 concerning her sleep results   Called and spoke with the patient. Pt had PFT not a sleep study. I reviewed the results to the pt and have sent rx to pts preferred pharmacy. Pt has been scheduled ov 9/29 and will do trial of Singulair .  Nfn

## 2024-04-03 NOTE — Telephone Encounter (Signed)
 Please see separate encounter. I have spoken with the patient.

## 2024-04-03 NOTE — Telephone Encounter (Signed)
 PT calling back Katherine Ramsey for results of sleep test. Triage said to take a message.

## 2024-05-24 ENCOUNTER — Other Ambulatory Visit: Payer: Self-pay

## 2024-05-24 DIAGNOSIS — E210 Primary hyperparathyroidism: Secondary | ICD-10-CM

## 2024-05-24 DIAGNOSIS — E538 Deficiency of other specified B group vitamins: Secondary | ICD-10-CM

## 2024-05-24 DIAGNOSIS — E559 Vitamin D deficiency, unspecified: Secondary | ICD-10-CM

## 2024-05-24 DIAGNOSIS — Z0000 Encounter for general adult medical examination without abnormal findings: Secondary | ICD-10-CM

## 2024-05-24 DIAGNOSIS — Z13220 Encounter for screening for lipoid disorders: Secondary | ICD-10-CM

## 2024-05-26 LAB — CBC WITH DIFFERENTIAL/PLATELET
Basophils Absolute: 0.1 x10E3/uL (ref 0.0–0.2)
Basos: 2 %
EOS (ABSOLUTE): 0.3 x10E3/uL (ref 0.0–0.4)
Eos: 7 %
Hematocrit: 40.9 % (ref 34.0–46.6)
Hemoglobin: 13.1 g/dL (ref 11.1–15.9)
Immature Grans (Abs): 0 x10E3/uL (ref 0.0–0.1)
Immature Granulocytes: 0 %
Lymphocytes Absolute: 1.5 x10E3/uL (ref 0.7–3.1)
Lymphs: 34 %
MCH: 31.3 pg (ref 26.6–33.0)
MCHC: 32 g/dL (ref 31.5–35.7)
MCV: 98 fL — ABNORMAL HIGH (ref 79–97)
Monocytes Absolute: 0.4 x10E3/uL (ref 0.1–0.9)
Monocytes: 10 %
Neutrophils Absolute: 2 x10E3/uL (ref 1.4–7.0)
Neutrophils: 47 %
Platelets: 209 x10E3/uL (ref 150–450)
RBC: 4.18 x10E6/uL (ref 3.77–5.28)
RDW: 11.8 % (ref 11.7–15.4)
WBC: 4.3 x10E3/uL (ref 3.4–10.8)

## 2024-05-26 LAB — CMP14+EGFR
ALT: 14 IU/L (ref 0–32)
AST: 22 IU/L (ref 0–40)
Albumin: 4.3 g/dL (ref 3.8–4.9)
Alkaline Phosphatase: 64 IU/L (ref 49–135)
BUN/Creatinine Ratio: 16 (ref 12–28)
BUN: 13 mg/dL (ref 8–27)
Bilirubin Total: 0.6 mg/dL (ref 0.0–1.2)
CO2: 25 mmol/L (ref 20–29)
Calcium: 9.3 mg/dL (ref 8.7–10.3)
Chloride: 102 mmol/L (ref 96–106)
Creatinine, Ser: 0.79 mg/dL (ref 0.57–1.00)
Globulin, Total: 2.5 g/dL (ref 1.5–4.5)
Glucose: 81 mg/dL (ref 70–99)
Potassium: 4.7 mmol/L (ref 3.5–5.2)
Sodium: 142 mmol/L (ref 134–144)
Total Protein: 6.8 g/dL (ref 6.0–8.5)
eGFR: 86 mL/min/1.73 (ref >59)

## 2024-05-26 LAB — LIPID PANEL
Chol/HDL Ratio: 2.2 ratio (ref 0.0–4.4)
Cholesterol, Total: 233 mg/dL — ABNORMAL HIGH (ref 100–199)
HDL: 108 mg/dL (ref >39)
LDL Chol Calc (NIH): 113 mg/dL — ABNORMAL HIGH (ref 0–99)
Triglycerides: 68 mg/dL (ref 0–149)
VLDL Cholesterol Cal: 12 mg/dL (ref 5–40)

## 2024-05-26 LAB — B12 AND FOLATE PANEL
Folate: 9.2 ng/mL (ref >3.0)
Vitamin B-12: 362 pg/mL (ref 232–1245)

## 2024-05-26 LAB — VITAMIN D 25 HYDROXY (VIT D DEFICIENCY, FRACTURES): Vit D, 25-Hydroxy: 37.4 ng/mL (ref 30.0–100.0)

## 2024-05-26 LAB — TSH: TSH: 2.71 u[IU]/mL (ref 0.450–4.500)

## 2024-06-05 ENCOUNTER — Ambulatory Visit: Payer: PRIVATE HEALTH INSURANCE | Admitting: Internal Medicine

## 2024-06-12 ENCOUNTER — Ambulatory Visit: Payer: Self-pay | Admitting: Family Medicine

## 2024-08-08 ENCOUNTER — Encounter: Payer: Self-pay | Admitting: Gastroenterology

## 2024-09-18 ENCOUNTER — Ambulatory Visit: Payer: PRIVATE HEALTH INSURANCE | Admitting: Gastroenterology

## 2024-09-19 ENCOUNTER — Encounter: Payer: Self-pay | Admitting: Family Medicine

## 2024-09-19 ENCOUNTER — Ambulatory Visit (INDEPENDENT_AMBULATORY_CARE_PROVIDER_SITE_OTHER): Payer: PRIVATE HEALTH INSURANCE | Admitting: Family Medicine

## 2024-09-19 VITALS — BP 114/62 | HR 54 | Ht 65.0 in | Wt 119.0 lb

## 2024-09-19 DIAGNOSIS — Z1211 Encounter for screening for malignant neoplasm of colon: Secondary | ICD-10-CM | POA: Diagnosis not present

## 2024-09-19 DIAGNOSIS — Z13220 Encounter for screening for lipoid disorders: Secondary | ICD-10-CM

## 2024-09-19 DIAGNOSIS — E210 Primary hyperparathyroidism: Secondary | ICD-10-CM | POA: Diagnosis not present

## 2024-09-19 DIAGNOSIS — Z0000 Encounter for general adult medical examination without abnormal findings: Secondary | ICD-10-CM | POA: Diagnosis not present

## 2024-09-19 DIAGNOSIS — R300 Dysuria: Secondary | ICD-10-CM

## 2024-09-19 NOTE — Progress Notes (Signed)
 " Katherine Ramsey - 59 y.o. female MRN [id number]  Date of birth: [birth year]  Subjective Chief Complaint  Patient presents with   Annual Exam   Urinary Tract Infection    HPI Katherine Ramsey is a 59 y.o. female here today for annual exam.   She reports that she is doing pretty well.  She is concerned about possible candida infection.  Had positive testing at integrative clinic previously and treated for several months.  She reports having issues with this in the past.    History of allergies and rhinitis.  Remains on singulair .    She is moderately active.  She feels that her diet is pretty good.   She is a non-smoker.  No EtOH.   Review of Systems  Constitutional:  Negative for chills, fever, malaise/fatigue and weight loss.  HENT:  Negative for congestion, ear pain and sore throat.   Eyes:  Negative for blurred vision, double vision and pain.  Respiratory:  Negative for cough and shortness of breath.   Cardiovascular:  Negative for chest pain and palpitations.  Gastrointestinal:  Negative for abdominal pain, blood in stool, constipation, heartburn and nausea.  Genitourinary:  Negative for dysuria and urgency.  Musculoskeletal:  Negative for joint pain and myalgias.  Neurological:  Negative for dizziness and headaches.  Endo/Heme/Allergies:  Does not bruise/bleed easily.  Psychiatric/Behavioral:  Negative for depression. The patient is not nervous/anxious and does not have insomnia.      Allergies[1]  Past Medical History:  Diagnosis Date   Gastric ulcer    Hypercholesterolemia    IBS (irritable bowel syndrome)    Kidney disease    Osteoporosis     Past Surgical History:  Procedure Laterality Date   BACK SURGERY     CESAREAN SECTION     LITHOTRIPSY Right    SINUS EXPLORATION      Social History   Socioeconomic History   Marital status: Married    Spouse name: Not on file   Number of children: Not on file   Years of education: Not on file   Highest education  level: 12th grade  Occupational History   Not on file  Tobacco Use   Smoking status: Never    Passive exposure: Never   Smokeless tobacco: Never  Vaping Use   Vaping status: Never Used  Substance and Sexual Activity   Alcohol use: Never   Drug use: Never   Sexual activity: Not Currently    Birth control/protection: None  Other Topics Concern   Not on file  Social History Narrative   Not on file   Social Drivers of Health   Tobacco Use: Low Risk (09/19/2024)   Patient History    Smoking Tobacco Use: Never    Smokeless Tobacco Use: Never    Passive Exposure: Never  Financial Resource Strain: Low Risk (09/17/2024)   Overall Financial Resource Strain (CARDIA)    Difficulty of Paying Living Expenses: Not very hard  Food Insecurity: No Food Insecurity (09/17/2024)   Epic    Worried About Programme Researcher, Broadcasting/film/video in the Last Year: Never true    Ran Out of Food in the Last Year: Never true  Transportation Needs: No Transportation Needs (09/17/2024)   Epic    Lack of Transportation (Medical): No    Lack of Transportation (Non-Medical): No  Physical Activity: Insufficiently Active (09/17/2024)   Exercise Vital Sign    Days of Exercise per Week: 3 days    Minutes of Exercise per Session: 20  min  Stress: No Stress Concern Present (09/17/2024)   [employer] of Occupational Health - Occupational Stress Questionnaire    Feeling of Stress: Only a little  Social Connections: Moderately Integrated (09/17/2024)   Social Connection and Isolation Panel    Frequency of Communication with Friends and Family: More than three times a week    Frequency of Social Gatherings with Friends and Family: Twice a week    Attends Religious Services: 1 to 4 times per year    Active Member of [employer] or Organizations: No    Attends Banker Meetings: Not on file    Marital Status: Married  Depression (PHQ2-9): Medium Risk (09/19/2024)   Depression (PHQ2-9)    PHQ-2 Score: 9  Alcohol Screen: Not  on file  Housing: Unknown (09/17/2024)   Epic    Unable to Pay for Housing in the Last Year: No    Number of Times Moved in the Last Year: Not on file    Homeless in the Last Year: No  Utilities: Not on file  Health Literacy: Not on file    Family History  Problem Relation Age of Onset   Colon cancer Father     Health Maintenance  Topic Date Due   HIV Screening  Never done   Hepatitis C Screening  Never done   Colonoscopy  11/06/2022   Influenza Vaccine  12/05/2024 (Originally 04/07/2024)   Zoster Vaccines- Shingrix (1 of 2) 12/18/2024 (Originally 12/19/2013)   COVID-19 Vaccine (5 - 2025-26 season) 05/07/2025 (Originally 05/08/2024)   DTaP/Tdap/Td (1 - Tdap) 09/19/2025 (Originally 12/20/1982)   Pneumococcal Vaccine: 50+ Years (1 of 2 - PCV) 09/19/2025 (Originally 12/20/1982)   Mammogram  12/05/2025   Cervical Cancer Screening (HPV/Pap Cotest)  10/02/2026   Hepatitis B Vaccines 19-59 Average Risk  Aged Out   HPV VACCINES  Aged Out   Meningococcal B Vaccine  Aged Out     ----------------------------------------------------------------------------------------------------------------------------------------------------------------------------------------------------------------- Physical Exam BP 114/62 (BP Location: Left Arm, Patient Position: Sitting, Cuff Size: Small)   Pulse (!) 54   Ht 5' 5 (1.651 m)   Wt 119 lb (54 kg)   SpO2 100%   BMI 19.80 kg/m   Physical Exam Constitutional:      General: She is not in acute distress. HENT:     Head: Normocephalic and atraumatic.     Right Ear: Tympanic membrane and ear canal normal.     Left Ear: Tympanic membrane and ear canal normal.     Nose: Nose normal.  Eyes:     General: No scleral icterus.    Conjunctiva/sclera: Conjunctivae normal.  Neck:     Thyroid: No thyromegaly.  Cardiovascular:     Rate and Rhythm: Normal rate and regular rhythm.     Heart sounds: Normal heart sounds.  Pulmonary:     Effort: Pulmonary effort  is normal.     Breath sounds: Normal breath sounds.  Abdominal:     General: Bowel sounds are normal. There is no distension.     Palpations: Abdomen is soft.     Tenderness: There is no abdominal tenderness. There is no guarding.  Musculoskeletal:        General: Normal range of motion.     Cervical back: Normal range of motion and neck supple.  Lymphadenopathy:     Cervical: No cervical adenopathy.  Skin:    General: Skin is warm and dry.     Findings: No rash.  Neurological:     General: No focal  deficit present.     Mental Status: She is alert and oriented to person, place, and time.     Cranial Nerves: No cranial nerve deficit.     Coordination: Coordination normal.  Psychiatric:        Mood and Affect: Mood normal.        Behavior: Behavior normal.     ------------------------------------------------------------------------------------------------------------------------------------------------------------------------------------------------------------------- Assessment and Plan  Well adult exam Well adult Orders Placed This Encounter  Procedures   Urine Culture   CMP14+EGFR   CBC with Differential/Platelet   Lipid Panel With LDL/HDL Ratio   Urinalysis, Routine w reflex microscopic   Ambulatory referral to Gastroenterology    Referral Priority:   Routine    Referral Type:   Consultation    Referral Reason:   Specialty Services Required    Number of Visits Requested:   1  Screenings: per lab orders Immunizations:  Declines.  Anticipatory guidance/Risk factor reduction:  Recommendations per AVS.    No orders of the defined types were placed in this encounter.   No follow-ups on file.        [1]  Allergies Allergen Reactions   Dairycare [Bacid] Shortness Of Breath and Other (See Comments)    GI issues   Egg Protein-Containing Drug Products Shortness Of Breath and Other (See Comments)    Abd Pain, GI issues   Gluten Meal Shortness Of Breath and Other  (See Comments)    GI issues   Other    Shellfish Allergy Rash   "

## 2024-09-19 NOTE — Patient Instructions (Signed)
 Preventive Care 58-61 Years Old, Female  Preventive care refers to lifestyle choices and visits with your health care provider that can promote health and wellness. Preventive care visits are also called wellness exams.  What can I expect for my preventive care visit?  Counseling  Your health care provider may ask you questions about your:  Medical history, including:  Past medical problems.  Family medical history.  Pregnancy history.  Current health, including:  Menstrual cycle.  Method of birth control.  Emotional well-being.  Home life and relationship well-being.  Sexual activity and sexual health.  Lifestyle, including:  Alcohol, nicotine or tobacco, and drug use.  Access to firearms.  Diet, exercise, and sleep habits.  Work and work Astronomer.  Sunscreen use.  Safety issues such as seatbelt and bike helmet use.  Physical exam  Your health care provider will check your:  Height and weight. These may be used to calculate your BMI (body mass index). BMI is a measurement that tells if you are at a healthy weight.  Waist circumference. This measures the distance around your waistline. This measurement also tells if you are at a healthy weight and may help predict your risk of certain diseases, such as type 2 diabetes and high blood pressure.  Heart rate and blood pressure.  Body temperature.  Skin for abnormal spots.  What immunizations do I need?    Vaccines are usually given at various ages, according to a schedule. Your health care provider will recommend vaccines for you based on your age, medical history, and lifestyle or other factors, such as travel or where you work.  What tests do I need?  Screening  Your health care provider may recommend screening tests for certain conditions. This may include:  Lipid and cholesterol levels.  Diabetes screening. This is done by checking your blood sugar (glucose) after you have not eaten for a while (fasting).  Pelvic exam and Pap test.  Hepatitis B test.  Hepatitis C  test.  HIV (human immunodeficiency virus) test.  STI (sexually transmitted infection) testing, if you are at risk.  Lung cancer screening.  Colorectal cancer screening.  Mammogram. Talk with your health care provider about when you should start having regular mammograms. This may depend on whether you have a family history of breast cancer.  BRCA-related cancer screening. This may be done if you have a family history of breast, ovarian, tubal, or peritoneal cancers.  Bone density scan. This is done to screen for osteoporosis.  Talk with your health care provider about your test results, treatment options, and if necessary, the need for more tests.  Follow these instructions at home:  Eating and drinking    Eat a diet that includes fresh fruits and vegetables, whole grains, lean protein, and low-fat dairy products.  Take vitamin and mineral supplements as recommended by your health care provider.  Do not drink alcohol if:  Your health care provider tells you not to drink.  You are pregnant, may be pregnant, or are planning to become pregnant.  If you drink alcohol:  Limit how much you have to 0-1 drink a day.  Know how much alcohol is in your drink. In the [place] one drink equals one 12 oz bottle of beer (355 mL), one 5 oz glass of wine (148 mL), or one 1 oz glass of hard liquor (44 mL).  Lifestyle  Brush your teeth every morning and night with fluoride toothpaste. Floss one time each day.  Exercise for at least  30 minutes 5 or more days each week.  Do not use any products that contain nicotine or tobacco. These products include cigarettes, chewing tobacco, and vaping devices, such as e-cigarettes. If you need help quitting, ask your health care provider.  Do not use drugs.  If you are sexually active, practice safe sex. Use a condom or other form of protection to prevent STIs.  If you do not wish to become pregnant, use a form of birth control. If you plan to become pregnant, see your health care provider for a  prepregnancy visit.  Take aspirin only as told by your health care provider. Make sure that you understand how much to take and what form to take. Work with your health care provider to find out whether it is safe and beneficial for you to take aspirin daily.  Find healthy ways to manage stress, such as:  Meditation, yoga, or listening to music.  Journaling.  Talking to a trusted person.  Spending time with friends and family.  Minimize exposure to UV radiation to reduce your risk of skin cancer.  Safety  Always wear your seat belt while driving or riding in a vehicle.  Do not drive:  If you have been drinking alcohol. Do not ride with someone who has been drinking.  When you are tired or distracted.  While texting.  If you have been using any mind-altering substances or drugs.  Wear a helmet and other protective equipment during sports activities.  If you have firearms in your house, make sure you follow all gun safety procedures.  Seek help if you have been physically or sexually abused.  What's next?  Visit your health care provider once a year for an annual wellness visit.  Ask your health care provider how often you should have your eyes and teeth checked.  Stay up to date on all vaccines.  This information is not intended to replace advice given to you by your health care provider. Make sure you discuss any questions you have with your health care provider.  Document Revised: 02/19/2021 Document Reviewed: 02/19/2021  Elsevier Patient Education  2024 [employer].

## 2024-09-19 NOTE — Assessment & Plan Note (Addendum)
 Well adult Orders Placed This Encounter  Procedures   Urine Culture   CMP14+EGFR   CBC with Differential/Platelet   Lipid Panel With LDL/HDL Ratio   Urinalysis, Routine w reflex microscopic   Ambulatory referral to Gastroenterology    Referral Priority:   Routine    Referral Type:   Consultation    Referral Reason:   Specialty Services Required    Number of Visits Requested:   1  Screenings: per lab orders Immunizations:  Declines.  Anticipatory guidance/Risk factor reduction:  Recommendations per AVS.

## 2024-09-21 LAB — CMP14+EGFR
ALT: 13 IU/L (ref 0–32)
AST: 18 IU/L (ref 0–40)
Albumin: 4.1 g/dL (ref 3.8–4.9)
Alkaline Phosphatase: 63 IU/L (ref 49–135)
BUN/Creatinine Ratio: 24 (ref 12–28)
BUN: 17 mg/dL (ref 8–27)
Bilirubin Total: 0.5 mg/dL (ref 0.0–1.2)
CO2: 26 mmol/L (ref 20–29)
Calcium: 9.6 mg/dL (ref 8.7–10.3)
Chloride: 102 mmol/L (ref 96–106)
Creatinine, Ser: 0.71 mg/dL (ref 0.57–1.00)
Globulin, Total: 2.3 g/dL (ref 1.5–4.5)
Glucose: 86 mg/dL (ref 70–99)
Potassium: 4.7 mmol/L (ref 3.5–5.2)
Sodium: 141 mmol/L (ref 134–144)
Total Protein: 6.4 g/dL (ref 6.0–8.5)
eGFR: 97 mL/min/1.73

## 2024-09-21 LAB — URINALYSIS, ROUTINE W REFLEX MICROSCOPIC
Bilirubin, UA: NEGATIVE
Glucose, UA: NEGATIVE
Ketones, UA: NEGATIVE
Leukocytes,UA: NEGATIVE
Nitrite, UA: NEGATIVE
Protein,UA: NEGATIVE
RBC, UA: NEGATIVE
Specific Gravity, UA: 1.015 (ref 1.005–1.030)
Urobilinogen, Ur: 0.2 mg/dL (ref 0.2–1.0)
pH, UA: 7.5 (ref 5.0–7.5)

## 2024-09-21 LAB — CBC WITH DIFFERENTIAL/PLATELET
Basophils Absolute: 0.1 x10E3/uL (ref 0.0–0.2)
Basos: 1 %
EOS (ABSOLUTE): 0.2 x10E3/uL (ref 0.0–0.4)
Eos: 5 %
Hematocrit: 41.4 % (ref 34.0–46.6)
Hemoglobin: 13.3 g/dL (ref 11.1–15.9)
Immature Grans (Abs): 0 x10E3/uL (ref 0.0–0.1)
Immature Granulocytes: 0 %
Lymphocytes Absolute: 1.3 x10E3/uL (ref 0.7–3.1)
Lymphs: 30 %
MCH: 31.3 pg (ref 26.6–33.0)
MCHC: 32.1 g/dL (ref 31.5–35.7)
MCV: 97 fL (ref 79–97)
Monocytes Absolute: 0.3 x10E3/uL (ref 0.1–0.9)
Monocytes: 7 %
Neutrophils Absolute: 2.6 x10E3/uL (ref 1.4–7.0)
Neutrophils: 57 %
Platelets: 212 x10E3/uL (ref 150–450)
RBC: 4.25 x10E6/uL (ref 3.77–5.28)
RDW: 11.9 % (ref 11.7–15.4)
WBC: 4.5 x10E3/uL (ref 3.4–10.8)

## 2024-09-21 LAB — URINE CULTURE

## 2024-09-21 LAB — LIPID PANEL WITH LDL/HDL RATIO
Cholesterol, Total: 249 mg/dL — ABNORMAL HIGH (ref 100–199)
HDL: 118 mg/dL
LDL Chol Calc (NIH): 114 mg/dL — ABNORMAL HIGH (ref 0–99)
LDL/HDL Ratio: 1 ratio (ref 0.0–3.2)
Triglycerides: 101 mg/dL (ref 0–149)
VLDL Cholesterol Cal: 17 mg/dL (ref 5–40)

## 2024-09-24 ENCOUNTER — Ambulatory Visit: Payer: Self-pay | Admitting: Family Medicine

## 2024-09-24 MED ORDER — AMOXICILLIN 500 MG PO CAPS: 500.0000 mg | ORAL_CAPSULE | Freq: Two times a day (BID) | ORAL | 0 refills | Status: AC

## 2024-09-25 ENCOUNTER — Encounter: Payer: Self-pay | Admitting: Internal Medicine

## 2024-09-28 ENCOUNTER — Telehealth: Payer: Self-pay

## 2024-09-28 ENCOUNTER — Encounter: Payer: Self-pay | Admitting: Obstetrics and Gynecology

## 2024-09-28 ENCOUNTER — Other Ambulatory Visit: Payer: Self-pay

## 2024-09-28 ENCOUNTER — Other Ambulatory Visit (HOSPITAL_COMMUNITY)
Admission: RE | Admit: 2024-09-28 | Discharge: 2024-09-28 | Disposition: A | Payer: PRIVATE HEALTH INSURANCE | Source: Ambulatory Visit | Attending: Obstetrics and Gynecology | Admitting: Obstetrics and Gynecology

## 2024-09-28 ENCOUNTER — Ambulatory Visit: Payer: PRIVATE HEALTH INSURANCE | Admitting: Obstetrics and Gynecology

## 2024-09-28 VITALS — BP 108/70 | HR 54 | Ht 65.0 in | Wt 116.0 lb

## 2024-09-28 DIAGNOSIS — N898 Other specified noninflammatory disorders of vagina: Secondary | ICD-10-CM | POA: Insufficient documentation

## 2024-09-28 DIAGNOSIS — Z01419 Encounter for gynecological examination (general) (routine) without abnormal findings: Secondary | ICD-10-CM

## 2024-09-28 DIAGNOSIS — N958 Other specified menopausal and perimenopausal disorders: Secondary | ICD-10-CM

## 2024-09-28 DIAGNOSIS — M810 Age-related osteoporosis without current pathological fracture: Secondary | ICD-10-CM

## 2024-09-28 LAB — CERVICOVAGINAL ANCILLARY ONLY
Candida Glabrata: NEGATIVE
Candida Vaginitis: NEGATIVE
Comment: NEGATIVE
Comment: NEGATIVE

## 2024-09-28 MED ORDER — ESTRADIOL 10 MCG VA TABS: 1.0000 | ORAL_TABLET | VAGINAL | 11 refills | Status: AC

## 2024-09-28 MED ORDER — ALBUTEROL SULFATE HFA 108 (90 BASE) MCG/ACT IN AERS: 2.0000 | INHALATION_SPRAY | Freq: Four times a day (QID) | RESPIRATORY_TRACT | 6 refills | Status: DC | PRN

## 2024-09-28 NOTE — Telephone Encounter (Signed)
 Pt needs follow up IN [place] // due to wanting refill on Albuterol  in [place] and dr wert had never given that to her but we went ahead and sent it in. Needs follow up

## 2024-09-28 NOTE — Telephone Encounter (Signed)
Dr. Wert, please advise. 

## 2024-09-28 NOTE — Progress Notes (Signed)
 "  ANNUAL GYNECOLOGY VISIT Chief Complaint  Patient presents with   GYN problem    Pt reported having vaginal dryness and would like to be tested for yeast, has history of yeast. Pt reported also would like to discuss hormonal changes, intercourse painful.      Subjective:  Katherine Ramsey is a 59 y.o. G1P1001 who presents for annual visit.  She reports vaginal dryness and pain with intercourse. Has tried vaginal estrogen cream as well as suppositories but these caused irritation/pressure and she was unable to tolerate them. She previously saw Dr. Cleatus who had discussed trial of compounded estrogen.  She also notes vaginal itching, requesting testing for yeast.   Last pap:  Lab Results  Component Value Date   DIAGPAP  10/02/2021    - Negative for intraepithelial lesion or malignancy (NILM)   HPVHIGH Negative 10/02/2021  History of abnormal pap: No Last mammogram: 11/2023 Last colonoscopy: 2022 Last DEXA: 2022- osteoporosis- not currently receiving treatment, states she had discussed injection previously   The pregnancy intention screening data noted above was reviewed. Potential methods of contraception were discussed. The patient elected to proceed with No data recorded.       09/19/2024    8:19 AM 09/17/2023    9:31 AM 11/14/2020    4:12 PM  Depression screen PHQ 2/9  Decreased Interest 1 1 1   Down, Depressed, Hopeless 1 1 1   PHQ - 2 Score 2 2 2   Altered sleeping 1 3 3   Tired, decreased energy 2 3 3   Change in appetite 1 1 1   Feeling bad or failure about yourself  1 2 2   Trouble concentrating 2 3 2   Moving slowly or fidgety/restless 0 2 2  Suicidal thoughts 0 0 1  PHQ-9 Score 9 16  16    Difficult doing work/chores Not difficult at all Very difficult Very difficult     Data saved with a previous flowsheet row definition        09/19/2024    8:19 AM 09/17/2023    9:31 AM 11/14/2020    4:13 PM  GAD 7 : Generalized Anxiety Score  Nervous, Anxious, on Edge 1  1  2     Control/stop worrying 1  2  2    Worry too much - different things 1  1  2    Trouble relaxing 1  2  2    Restless 1  1  2    Easily annoyed or irritable 2  2  2    Afraid - awful might happen 1  1  2    Total GAD 7 Score 8 10 14   Anxiety Difficulty Not difficult at all Very difficult Somewhat difficult     Data saved with a previous flowsheet row definition      OB History     Gravida  1   Para  1   Term  1   Preterm      AB      Living  1      SAB      IAB      Ectopic      Multiple      Live Births  1           Past Medical History:  Diagnosis Date   Gastric ulcer    Hypercholesterolemia    IBS (irritable bowel syndrome)    Kidney disease    Osteoporosis     Past Surgical History:  Procedure Laterality Date   BACK SURGERY  CESAREAN SECTION     LITHOTRIPSY Right    SINUS EXPLORATION      Social History   Socioeconomic History   Marital status: Married    Spouse name: Not on file   Number of children: Not on file   Years of education: Not on file   Highest education level: 12th grade  Occupational History   Not on file  Tobacco Use   Smoking status: Never    Passive exposure: Never   Smokeless tobacco: Never  Vaping Use   Vaping status: Never Used  Substance and Sexual Activity   Alcohol use: Never   Drug use: Never   Sexual activity: Yes    Birth control/protection: Post-menopausal  Other Topics Concern   Not on file  Social History Narrative   Not on file   Social Drivers of Health   Tobacco Use: Low Risk (09/19/2024)   Patient History    Smoking Tobacco Use: Never    Smokeless Tobacco Use: Never    Passive Exposure: Never  Financial Resource Strain: Low Risk (09/17/2024)   Overall Financial Resource Strain (CARDIA)    Difficulty of Paying Living Expenses: Not very hard  Food Insecurity: No Food Insecurity (09/17/2024)   Epic    Worried About Programme Researcher, Broadcasting/film/video in the Last Year: Never true    Ran Out of Food in the  Last Year: Never true  Transportation Needs: No Transportation Needs (09/17/2024)   Epic    Lack of Transportation (Medical): No    Lack of Transportation (Non-Medical): No  Physical Activity: Insufficiently Active (09/17/2024)   Exercise Vital Sign    Days of Exercise per Week: 3 days    Minutes of Exercise per Session: 20 min  Stress: No Stress Concern Present (09/17/2024)   [employer] of Occupational Health - Occupational Stress Questionnaire    Feeling of Stress: Only a little  Social Connections: Moderately Integrated (09/17/2024)   Social Connection and Isolation Panel    Frequency of Communication with Friends and Family: More than three times a week    Frequency of Social Gatherings with Friends and Family: Twice a week    Attends Religious Services: 1 to 4 times per year    Active Member of [employer] or Organizations: No    Attends Engineer, Structural: Not on file    Marital Status: Married  Depression (PHQ2-9): Medium Risk (09/19/2024)   Depression (PHQ2-9)    PHQ-2 Score: 9  Alcohol Screen: Not on file  Housing: Unknown (09/17/2024)   Epic    Unable to Pay for Housing in the Last Year: No    Number of Times Moved in the Last Year: Not on file    Homeless in the Last Year: No  Utilities: Not on file  Health Literacy: Not on file    Family History  Problem Relation Age of Onset   Colon cancer Father    Breast cancer Neg Hx    Ovarian cancer Neg Hx     Medications Ordered Prior to Encounter[1]  Allergies[2]   Objective:   Vitals:   09/28/24 0810  BP: 108/70  Pulse: (!) 54  Weight: 116 lb (52.6 kg)  Height: 5' 5 (1.651 m)   Physical Examination:   General appearance - well appearing, and in no distress  Mental status - alert, oriented to person, place, and time  Psych:  normal mood and affect  Skin - warm and dry, normal color, no suspicious lesions noted  Chest -  effort normal, all lung fields clear to auscultation bilaterally  Heart -  normal rate and regular rhythm  Neck:  midline trachea, no thyromegaly or nodules  Breasts - breasts appear normal, no suspicious masses, no skin or nipple changes or  axillary nodes  Abdomen - soft, nontender, nondistended, no masses or organomegaly  Pelvic -  VULVA: normal appearing vulva with no masses, tenderness or lesions, atrophic   VAGINA: normal appearing vagina with normal color and discharge, no lesions, atrophic  CERVIX: normal appearing cervix without discharge or lesions   UTERUS: uterus is felt to be normal size, shape, consistency and nontender   ADNEXA: No adnexal masses or tenderness noted.  Extremities:  No swelling or varicosities noted  Chaperone present for exam  Assessment and Plan:  1. Well woman exam with routine gynecological exam (Primary) Pap up to date  Mammogram due in a couple months, pt declines order, states she will schedule Colonoscopy up to date, has upcoming GI appt DEXA 2022 osteoporosis- see below  2. Genitourinary syndrome of menopause Tried and failed vaginal estrogen cream and suppositories due to irritation, possibly due to additives? Discussed vagifem , Estring  vs compounded vaginal estrogen cream. Will trial vagifem . If unable to tolerate, then will switch to compounded cream. Patient verbalized understanding. - Estradiol  10 MCG TABS vaginal tablet; Place 1 tablet (10 mcg total) vaginally as directed. Insert 1 tablet vaginally once daily for 2 weeks then twice weekly.  Dispense: 24 tablet; Refill: 11  3. Vaginal itching - Cervicovaginal ancillary only  4. Osteoporosis, unspecified osteoporosis type, unspecified pathological fracture presence Diagnosed in 2022, does not seem to be undergoing treatment but patient states she is interested in her options. Message sent to PCP for follow up   No follow-ups on file.  Future Appointments  Date Time Provider Department Center  10/11/2024  9:40 AM McMichael, Nestor HERO, PA-C [facility]     Rollo ONEIDA Bring, MD, FACOG Obstetrician & Gynecologist, [facility] for [facility], [facility]     [1]  Current Outpatient Medications on File Prior to Visit  Medication Sig Dispense Refill   amoxicillin  (AMOXIL ) 500 MG capsule Take 1 capsule (500 mg total) by mouth 2 (two) times daily for 5 days. 10 capsule 0   montelukast  (SINGULAIR ) 10 MG tablet Take 1 tablet (10 mg total) by mouth at bedtime. 30 tablet 3   estradiol  (ESTRACE ) 0.01 % CREA vaginal cream Place 1 Applicatorful vaginally 3 (three) times a week. (Patient not taking: Reported on 09/28/2024)     No current facility-administered medications on file prior to visit.  [2]  Allergies Allergen Reactions   Dairycare [Bacid] Shortness Of Breath and Other (See Comments)    GI issues   Egg Protein-Containing Drug Products Shortness Of Breath and Other (See Comments)    Abd Pain, GI issues   Gluten Meal Shortness Of Breath and Other (See Comments)    GI issues   Other    Shellfish Allergy Rash   "

## 2024-09-28 NOTE — Telephone Encounter (Signed)
 Due to being sent in [facility] clinical pool I was unaware // I have verbally informed Dr Darlean as well that there is a message in here that needs attention

## 2024-09-28 NOTE — Telephone Encounter (Unsigned)
 Copied from CRM #[id number]. Topic: Clinical - Medication Question >> Sep 27, 2024 12:44 PM LaVerne A wrote: Reason for CRM: Patient's spouse, JoSip is calling regarding a Mychart message that was sent on 09/25/24.  Here is what he wanted: Could you please send me a prescription for air inhaler for asthma to my local [facility] drugstore. The address is: [address] [place], [place] [address]. I know you sent me the pills but I would like you to send me a prescription for the inhaler as well.  Please return his call at [phone] or [phone].  Thanks. >> Sep 28, 2024  3:02 PM Leila BROCKS wrote: Patient's spouse Corina [phone] states sent a message through Mychart 09/25/24 for refill on inhaler, does not have name of medication and called the office 09/27/24. Josip states is upset, no one has called back or respond back on Mychart. Josip wants to speak with the manager. Per CAL, send crm to clinical and warm transferred to manager.

## 2024-09-29 ENCOUNTER — Ambulatory Visit: Payer: Self-pay | Admitting: Obstetrics and Gynecology

## 2024-09-29 ENCOUNTER — Other Ambulatory Visit: Payer: Self-pay

## 2024-09-29 MED ORDER — ALBUTEROL SULFATE HFA 108 (90 BASE) MCG/ACT IN AERS: 2.0000 | INHALATION_SPRAY | Freq: Four times a day (QID) | RESPIRATORY_TRACT | 6 refills | Status: AC | PRN

## 2024-09-29 NOTE — Telephone Encounter (Signed)
 Patient scheduled in [place].

## 2024-10-11 ENCOUNTER — Ambulatory Visit: Payer: PRIVATE HEALTH INSURANCE | Admitting: Gastroenterology

## 2024-10-11 ENCOUNTER — Encounter: Payer: Self-pay | Admitting: Gastroenterology

## 2024-10-11 VITALS — BP 104/66 | HR 60 | Ht 65.0 in | Wt 116.0 lb

## 2024-10-11 DIAGNOSIS — R140 Abdominal distension (gaseous): Secondary | ICD-10-CM | POA: Diagnosis not present

## 2024-10-11 DIAGNOSIS — K638219 Small intestinal bacterial overgrowth, unspecified: Secondary | ICD-10-CM | POA: Diagnosis not present

## 2024-10-11 DIAGNOSIS — R1032 Left lower quadrant pain: Secondary | ICD-10-CM | POA: Diagnosis not present

## 2024-10-11 DIAGNOSIS — Z800 Family history of malignant neoplasm of digestive organs: Secondary | ICD-10-CM

## 2024-10-11 DIAGNOSIS — Z8711 Personal history of peptic ulcer disease: Secondary | ICD-10-CM | POA: Diagnosis not present

## 2024-10-11 DIAGNOSIS — R1012 Left upper quadrant pain: Secondary | ICD-10-CM

## 2024-10-11 DIAGNOSIS — K219 Gastro-esophageal reflux disease without esophagitis: Secondary | ICD-10-CM | POA: Diagnosis not present

## 2024-10-11 MED ORDER — METRONIDAZOLE 250 MG PO TABS: 250.0000 mg | ORAL_TABLET | Freq: Three times a day (TID) | ORAL | 0 refills | Status: AC

## 2024-10-11 MED ORDER — NA SULFATE-K SULFATE-MG SULF 17.5-3.13-1.6 GM/177ML PO SOLN: 1.0000 | Freq: Once | ORAL | 0 refills | Status: AC

## 2024-10-11 NOTE — Patient Instructions (Addendum)
 We have sent the following medications to your pharmacy for you to pick up at your convenience: Flagyl   Suprep  You have been scheduled for an endoscopy and colonoscopy. Please follow the written instructions given to you at your visit today.  If you use inhalers (even only as needed), please bring them with you on the day of your procedure.  DO NOT TAKE 7 DAYS PRIOR TO TEST- Trulicity (dulaglutide) Ozempic, Wegovy (semaglutide) Mounjaro, Zepbound (tirzepatide) Bydureon Bcise (exanatide extended release)  DO NOT TAKE 1 DAY PRIOR TO YOUR TEST Rybelsus (semaglutide) Adlyxin (lixisenatide) Victoza (liraglutide) Byetta (exanatide) ___________________________________________________________________________   Due to recent changes in healthcare laws, you may see the results of your imaging and laboratory studies on MyChart before your provider has had a chance to review them.  We understand that in some cases there may be results that are confusing or concerning to you. Not all laboratory results come back in the same time frame and the provider may be waiting for multiple results in order to interpret others.  Please give us  48 hours in order for your provider to thoroughly review all the results before contacting the office for clarification of your results.    I appreciate the  opportunity to care for you  Thank You   [facility]

## 2024-11-07 ENCOUNTER — Encounter: Payer: PRIVATE HEALTH INSURANCE | Admitting: Gastroenterology

## 2025-01-01 ENCOUNTER — Ambulatory Visit: Payer: PRIVATE HEALTH INSURANCE | Admitting: Internal Medicine
